# Patient Record
Sex: Female | Born: 1945
Health system: Southern US, Community
[De-identification: ages and names within clinical notes are randomized; demographics above are authoritative.]

## PROBLEM LIST (undated history)

## (undated) DIAGNOSIS — J45909 Unspecified asthma, uncomplicated: Secondary | ICD-10-CM

## (undated) DIAGNOSIS — M109 Gout, unspecified: Secondary | ICD-10-CM

## (undated) DIAGNOSIS — J449 Chronic obstructive pulmonary disease, unspecified: Secondary | ICD-10-CM

## (undated) DIAGNOSIS — I10 Essential (primary) hypertension: Secondary | ICD-10-CM

## (undated) HISTORY — PX: COLONOSCOPY: SHX174

## (undated) HISTORY — PX: COLONOSCOPY WITH PROPOFOL: SHX5780

## (undated) HISTORY — PX: ELBOW SURGERY: SHX618

---

## 1991-10-13 HISTORY — PX: BREAST EXCISIONAL BIOPSY: SUR124

## 2007-01-01 ENCOUNTER — Emergency Department: Payer: Self-pay | Admitting: Emergency Medicine

## 2007-03-22 ENCOUNTER — Ambulatory Visit: Payer: Self-pay | Admitting: Family Medicine

## 2007-10-29 ENCOUNTER — Emergency Department: Payer: Self-pay | Admitting: Emergency Medicine

## 2007-11-17 ENCOUNTER — Ambulatory Visit: Payer: Self-pay | Admitting: Family Medicine

## 2008-04-19 ENCOUNTER — Ambulatory Visit: Payer: Self-pay | Admitting: Family Medicine

## 2008-07-04 ENCOUNTER — Ambulatory Visit: Payer: Self-pay | Admitting: Family Medicine

## 2009-07-09 ENCOUNTER — Ambulatory Visit: Payer: Self-pay | Admitting: Family Medicine

## 2010-09-30 ENCOUNTER — Ambulatory Visit: Payer: Self-pay | Admitting: Family Medicine

## 2010-11-23 ENCOUNTER — Emergency Department: Payer: Self-pay | Admitting: Emergency Medicine

## 2011-03-29 ENCOUNTER — Emergency Department: Payer: Self-pay | Admitting: Emergency Medicine

## 2011-04-17 ENCOUNTER — Emergency Department: Payer: Self-pay | Admitting: *Deleted

## 2011-04-27 ENCOUNTER — Inpatient Hospital Stay: Payer: Self-pay | Admitting: Internal Medicine

## 2011-04-27 DIAGNOSIS — R079 Chest pain, unspecified: Secondary | ICD-10-CM

## 2011-04-27 DIAGNOSIS — R7989 Other specified abnormal findings of blood chemistry: Secondary | ICD-10-CM

## 2011-05-19 ENCOUNTER — Encounter: Payer: Self-pay | Admitting: Cardiovascular Disease

## 2011-07-29 ENCOUNTER — Emergency Department: Payer: Self-pay | Admitting: *Deleted

## 2011-09-23 ENCOUNTER — Emergency Department: Payer: Self-pay | Admitting: *Deleted

## 2011-09-26 ENCOUNTER — Inpatient Hospital Stay: Payer: Self-pay | Admitting: Internal Medicine

## 2011-10-29 ENCOUNTER — Ambulatory Visit: Payer: Self-pay | Admitting: Family Medicine

## 2012-04-12 ENCOUNTER — Emergency Department: Payer: Self-pay | Admitting: Emergency Medicine

## 2012-07-20 ENCOUNTER — Emergency Department: Payer: Self-pay | Admitting: Emergency Medicine

## 2014-07-03 ENCOUNTER — Ambulatory Visit: Payer: Self-pay | Admitting: Gastroenterology

## 2014-07-05 ENCOUNTER — Ambulatory Visit: Payer: Self-pay | Admitting: Family Medicine

## 2014-07-05 LAB — PATHOLOGY REPORT

## 2014-09-13 ENCOUNTER — Emergency Department: Payer: Self-pay | Admitting: Emergency Medicine

## 2014-11-05 ENCOUNTER — Encounter: Payer: Self-pay | Admitting: Surgery

## 2014-11-12 ENCOUNTER — Encounter: Payer: Self-pay | Admitting: Surgery

## 2014-12-11 ENCOUNTER — Encounter
Admit: 2014-12-11 | Disposition: A | Discharge status: Home / Self Care | Payer: Self-pay | Attending: Surgery | Admitting: Surgery

## 2015-02-03 NOTE — H&P (Signed)
PATIENT NAME:  Brandi Evans, Brandi Evans MR#:  [id number] DATE OF BIRTH:  August 07, 1946  DATE OF ADMISSION:  09/26/2011  Dictation was stopped in the middle.   REVIEW OF SYSTEMS: CONSTITUTIONAL: Patient denies any fever. EYES: No blurred vision. ENT: No tinnitus. No ear pain. No epistaxis. RESPIRATORY: No cough. No wheezing. CARDIOVASCULAR: Chest pain present. No orthopnea or pedal edema. No palpitations. GASTROINTESTINAL: No nausea. No vomiting. No abdominal pain. GENITOURINARY: Recently suffered a urinary tract infection. INTEGUMENT: No skin rashes. MUSCULOSKELETAL: No joint pain. NEUROLOGIC: No numbness or weakness.   PHYSICAL EXAMINATION:  VITAL SIGNS: Temperature 99.7, pulse 71, respirations 18, blood pressure 170/69, sats 97%.   GENERAL: She is alert, awake, oriented, not in distress.   HEENT: Head atraumatic, normocephalic. Pupils are equally reacting to light. Extraocular movements are intact. ENT: No tympanic membrane congestion. No turbinate hypertrophy. No oropharyngeal erythema.   NECK: Normal range of motion. No JVD. No carotid bruit.   CARDIOVASCULAR: S1 and S2 regular. No murmurs. Patient does have some costochondral tenderness.   RESPIRATORY: Bilaterally clear to auscultation. No wheeze. No rales.   ABDOMEN: Soft, nontender, nondistended. Bowel sounds present.   EXTREMITIES: No extremity edema. No cyanosis. No clubbing. No neurological deficits.   SKIN: Warm and dry.   NEUROLOGICAL: Patient has no focal neurological deficits.   PSYCH: Alert, awake, oriented x3.   LABORATORY, DIAGNOSTIC AND RADIOLOGICAL DATA: WBC 5.4, hemoglobin 13.2, hematocrit 39.9, platelets 250. Electrolytes: Sodium 142, potassium 3.4, chloride 101, bicarbonate 30, BUN 14, creatinine 1, glucose 97.   Troponin 0.11. Patient had a CAT scan of the abdomen on 12/13 which showed some atelectasis in the right middle lobe and lingula versus linear fibrosis. Patient does have some calcifications in the uterus and also  left adrenal adenoma. Patient found to have bilateral nodules. Right now EKG shows normal sinus rhythm with no ST-T changes, 70 beats per minute.   ASSESSMENT AND PLAN: 69 year old female with:  1. Chest pain and elevated troponins. Patient will be admitted to the hospitalist service for non-ST-elevation myocardial infarction. Trend the troponins along with CK and start the patient on aspirin, beta blockers, lisinopril and statins along with nitro paste and also full dose Lovenox. She had same complaint in July. At that time she had an echo and the echo showed ejection fraction of 55% but she did not have a stress test so we are going to schedule for stress test on Monday.  2. Patient has history of hypertension. She is already on beta blockers and HCTZ and ACE inhibitors. Blood pressure is fine.  3. History of pulmonary  fibrosis or atelectasis on the CAT scan recently. 4. Escherichia coli urinary tract infection. Patient will be on Cipro 500 mg IV b.i.d.  5. History of hyperlipidemia. She is on statins.  6. History of asthma. Continue Advair.   TOTAL TIME SPENT ON HISTORY AND PHYSICAL: More than 55 minutes.   ____________________________ Epifanio Lesches, MD sk:cms D: 09/26/2011 16:40:03 ET T: 09/27/2011 08:32:12 ET JOB#: [id number]  cc: Epifanio Lesches, MD, <Dictator> Epifanio Lesches MD ELECTRONICALLY SIGNED 10/15/2011 19:28

## 2015-02-03 NOTE — H&P (Signed)
PATIENT NAME:  Brandi Evans, Brandi Evans MR#:  [id number] DATE OF BIRTH:  1945-11-27  DATE OF ADMISSION:  09/26/2011  PRIMARY CARE PHYSICIAN: Dr. Lovie Macadamia  CHIEF COMPLAINT: Chest pain.   HISTORY OF PRESENT ILLNESS: Patient is a 69 year old female patient with history of hypertension, hyperlipidemia, recently diagnosed Escherichia coli and also history of asthma came in because of chest pain in the middle of the chest, started yesterday, radiating to the back. Patient says that pain medicines are relieving. Patient's pain is aggravated by movement and did not have any cough or fever. No trouble breathing. Patient found to have mild troponin elevation of 0.11 so I was asked to admit the patient for chest pain with positive troponins.  PAST MEDICAL HISTORY:  1. Hypertension.  2. Asthma.  3. She came to the Emergency Room on 13th and found to have urinary tract infection and she was discharged with antibiotics and pain medicines.   ALLERGIES: Penicillin.   SOCIAL HISTORY: Patient does not smoke. No alcohol. No drugs.   PAST SURGICAL HISTORY: Cesarean section.   FAMILY HISTORY: History of stroke and heart disease in the family.  1. MEDICATIONS: Advair 100/50, 1 puff b.i.d.  2. HCTZ 25 mg p.o. daily.  3. Metoprolol 50 mg p.o. b.i.d.  4. Gabapentin 300 mg p.o. daily p.r.n.    ____________________________ Epifanio Lesches, MD sk:cms D: 09/26/2011 16:30:49 ET T: 09/27/2011 08:28:01 ET JOB#: [id number]  cc: Epifanio Lesches, MD, <Dictator> Youlanda Roys. Lovie Macadamia, MD Epifanio Lesches MD ELECTRONICALLY SIGNED 10/15/2011 19:27

## 2015-02-03 NOTE — Consult Note (Signed)
PATIENT NAME:  Brandi Evans, DOWSE MR#:  [id number] DATE OF BIRTH:  1946/09/10  DATE OF CONSULTATION:  09/27/2011  REFERRING PHYSICIAN:  Dr. Vianne Bulls CONSULTING PHYSICIAN:  Isaias Cowman, MD  PRIMARY CARE PHYSICIAN: Dr. Lovie Macadamia  CHIEF COMPLAINT: Chest pain.   HISTORY OF PRESENT ILLNESS: The patient is a 69 year old female referred for evaluation of chest pain and elevated troponin. The patient was recently diagnosed with a urinary tract infection and treated with antibiotics. Subsequently, the patient developed substernal chest pressure which has persisted over 2 to 3 days. The patient was admitted to telemetry where she has a borderline elevated troponin of 0.11.   PAST MEDICAL HISTORY:  1. Hypertension.  2. Asthma.   MEDICATIONS:  1. Metoprolol 50 mg b.i.d.  2. HCTZ 25 mg daily.  3. Advair 100/50 1 puff b.i.d.  4. Gabapentin 300 mg daily.   SOCIAL HISTORY: The patient denies tobacco abuse.   FAMILY HISTORY: Notable for cerebrovascular disease.   REVIEW OF SYSTEMS:  CONSTITUTIONAL: No fever or chills. EYES: No blurry vision.  EARS: No hearing loss. RESPIRATORY: No shortness of breath. CARDIOVASCULAR: Chest pain as described above. GASTROINTESTINAL: No nausea, vomiting, diarrhea, or constipation.  GU:  The patient did have bilateral flank pain and dysuria. ENDOCRINE: The patient denies polyuria or polydipsia. MUSCULOSKELETAL: The patient denies arthralgias or myalgias. NEUROLOGICAL: The patient denies focal muscle weakness or numbness. PSYCH: The patient denies anxiety or depression.   PHYSICAL EXAMINATION:  VITAL SIGNS: Blood pressure 130/82, pulse 70, respirations 17, temperature 97.3, pulse ox 97%.   HEENT: Pupils equal, reactive to light and accommodation.   NECK: Supple without thyromegaly.   LUNGS: Clear.   HEART: Normal jugular venous pressure. Normal point of maximal impulse. Regular rate and rhythm. Normal S1, S2. No appreciable gallop, murmur, or rub.   ABDOMEN:  Soft and nontender. Pulses were intact bilaterally.   MUSCULOSKELETAL: Normal muscle tone.   NEUROLOGIC: The patient is alert and oriented times three. Motor and sensory are both grossly intact.   IMPRESSION: 69 year old female who presents with chest pain and elevated troponin.   RECOMMENDATIONS:  1. Agree with current therapy.  2. Continue full dose anticoagulation with Lovenox.  3. Proceed with cardiac catheterization with selective coronary arteriography in the a.m. The risks, benefits, and alternatives were explained and informed written consent obtained.   ____________________________ Isaias Cowman, MD ap:bjt D: 09/27/2011 10:07:12 ET T: 09/27/2011 15:49:29 ET JOB#: [id number]  cc: Isaias Cowman, MD, <Dictator> Isaias Cowman MD ELECTRONICALLY SIGNED 10/24/2011 10:03

## 2015-07-21 ENCOUNTER — Encounter: Payer: Self-pay | Admitting: Emergency Medicine

## 2015-07-21 ENCOUNTER — Emergency Department: Payer: Commercial Managed Care - HMO

## 2015-07-21 ENCOUNTER — Emergency Department
Admission: EM | Admit: 2015-07-21 | Discharge: 2015-07-21 | Disposition: A | Discharge status: Home / Self Care | Payer: Commercial Managed Care - HMO | Attending: Emergency Medicine | Admitting: Emergency Medicine

## 2015-07-21 DIAGNOSIS — H6123 Impacted cerumen, bilateral: Secondary | ICD-10-CM | POA: Insufficient documentation

## 2015-07-21 DIAGNOSIS — Z880 Allergy status to penicillin: Secondary | ICD-10-CM | POA: Diagnosis not present

## 2015-07-21 DIAGNOSIS — H9202 Otalgia, left ear: Secondary | ICD-10-CM | POA: Diagnosis present

## 2015-07-21 HISTORY — DX: Unspecified asthma, uncomplicated: J45.909

## 2015-07-21 MED ORDER — KETOROLAC TROMETHAMINE 30 MG/ML IJ SOLN: 30.0000 mg | Freq: Once | INTRAMUSCULAR | Status: DC

## 2015-07-21 MED ORDER — CARBAMIDE PEROXIDE 6.5 % OT SOLN
5.0000 [drp] | Freq: Once | OTIC | Status: AC
  Administered 2015-07-21: 5 [drp] via OTIC
  Filled 2015-07-21: qty 15

## 2015-07-21 NOTE — ED Notes (Signed)
Patient with no complaints at this time. Respirations even and unlabored. Skin warm/dry. Discharge instructions reviewed with patient at this time. Patient given opportunity to voice concerns/ask questions. Patient discharged at this time and left Emergency Department with steady gait.   

## 2015-07-21 NOTE — ED Provider Notes (Signed)
[facility] Emergency Department Provider Note  ____________________________________________  Time seen: Approximately 6:06 PM  I have reviewed the triage vital signs and the nursing notes.   HISTORY  Chief Complaint Otalgia    HPI Brandi Evans is a 69 y.o. female presents with decreased hearing in left ear.Patient states that he has been decreased for the past 3 days and feels like a roaring sound was going on in her head.   Past Medical History  Diagnosis Date  . Asthma     There are no active problems to display for this patient.   Past Surgical History  Procedure Laterality Date  . Cesarean section    . Elbow surgery      No current outpatient prescriptions on file.  Allergies Penicillins  No family history on file.  Social History Social History  Substance Use Topics  . Smoking status: Never Smoker   . Smokeless tobacco: None  . Alcohol Use: No    Review of Systems Constitutional: No fever/chills Eyes: No visual changes. ENT: No sore throat. Decreased hearing left ear. Cardiovascular: Denies chest pain. Respiratory: Denies shortness of breath. Gastrointestinal: No abdominal pain.  No nausea, no vomiting.  No diarrhea.  No constipation. Genitourinary: Negative for dysuria. Musculoskeletal: Negative for back pain. Skin: Negative for rash. Neurological: Negative for headaches, focal weakness or numbness.  10-point ROS otherwise negative.  ____________________________________________   PHYSICAL EXAM:  VITAL SIGNS: ED Triage Vitals  Enc Vitals Group     BP 07/21/15 1715 160/93 mmHg     Pulse Rate 07/21/15 1715 71     Resp 07/21/15 1715 16     Temp 07/21/15 1715 97.9 F (36.6 C)     Temp Source 07/21/15 1715 Oral     SpO2 07/21/15 1715 96 %     Weight 07/21/15 1715 155 lb (70.308 kg)     Height 07/21/15 1715 5\' 2"  (1.575 m)     Head Cir --      Peak Flow --      Pain Score 07/21/15 1716 6     Pain Loc --       Pain Edu? --      Excl. in [place]? --     Constitutional: Alert and oriented. Well appearing and in no acute distress. Eyes: Conjunctivae are normal. PERRL. EOMI. Head: Atraumatic. Cerumen impaction both ears. Nose: No congestion/rhinnorhea. Mouth/Throat: Mucous membranes are moist.  Oropharynx non-erythematous. Neck: No stridor.   Musculoskeletal: No lower extremity tenderness nor edema.  No joint effusions. Neurologic:  Normal speech and language. No gross focal neurologic deficits are appreciated. No gait instability. Skin:  Skin is warm, dry and intact. No rash noted. Psychiatric: Mood and affect are normal. Speech and behavior are normal.  ____________________________________________   LABS (all labs ordered are listed, but only abnormal results are displayed)  Labs Reviewed - No data to display ____________________________________________   PROCEDURES  Procedure(s) performed: None  Critical Care performed: No  ____________________________________________   INITIAL IMPRESSION / ASSESSMENT AND PLAN / ED COURSE  Pertinent labs & imaging results that were available during my care of the patient were reviewed by me and considered in my medical decision making (see chart for details).  Bilateral cerumen impaction. Both ears irrigated with complete resolution. Patient states that she can hear completely with no difficulties. Discharged to follow-up with her PCP or return to the ER as needed. ____________________________________________   FINAL CLINICAL IMPRESSION(S) / ED DIAGNOSES  Final diagnoses:  Cerumen  impaction, bilateral      Arlyss Repress, PA-C 07/21/15 1935  Eula Listen, MD 07/21/15 1946

## 2015-07-21 NOTE — ED Notes (Signed)
Pt reports left ear pain x3 days, reports unable to hear. Pt denies injury.

## 2015-07-21 NOTE — Discharge Instructions (Signed)
Cerumen Impaction The structures of the external ear canal secrete a waxy substance known as cerumen. Excess cerumen can build up in the ear canal, causing a condition known as cerumen impaction. Cerumen impaction can cause ear pain and disrupt the function of the ear. The rate of cerumen production differs for each individual. In certain individuals, the configuration of the ear canal may decrease his or her ability to naturally remove cerumen. CAUSES Cerumen impaction is caused by excessive cerumen production or buildup. RISK FACTORS  Frequent use of swabs to clean ears.  Having narrow ear canals.  Having eczema.  Being dehydrated. SIGNS AND SYMPTOMS  Diminished hearing.  Ear drainage.  Ear pain.  Ear itch. TREATMENT Treatment may involve:  Over-the-counter or prescription ear drops to soften the cerumen.  Removal of cerumen by a health care provider. This may be done with:  Irrigation with warm water. This is the most common method of removal.  Ear curettes and other instruments.  Surgery. This may be done in severe cases. HOME CARE INSTRUCTIONS  Take medicines only as directed by your health care provider.  Do not insert objects into the ear with the intent of cleaning the ear. PREVENTION  Do not insert objects into the ear, even with the intent of cleaning the ear. Removing cerumen as a part of normal hygiene is not necessary, and the use of swabs in the ear canal is not recommended.  Drink enough water to keep your urine clear or pale yellow.  Control your eczema if you have it. SEEK MEDICAL CARE IF:  You develop ear pain.  You develop bleeding from the ear.  The cerumen does not clear after you use ear drops as directed.   This information is not intended to replace advice given to you by your health care provider. Make sure you discuss any questions you have with your health care provider.   Document Released: 11/05/2004 Document Revised: 10/19/2014  Document Reviewed: 05/15/2015 Elsevier Interactive Patient Education 2016 [employer]  

## 2015-12-05 ENCOUNTER — Other Ambulatory Visit: Payer: Self-pay | Admitting: Family Medicine

## 2015-12-05 DIAGNOSIS — Z1231 Encounter for screening mammogram for malignant neoplasm of breast: Secondary | ICD-10-CM

## 2015-12-20 ENCOUNTER — Other Ambulatory Visit: Payer: Self-pay | Admitting: Family Medicine

## 2015-12-20 ENCOUNTER — Ambulatory Visit
Admission: RE | Admit: 2015-12-20 | Discharge: 2015-12-20 | Disposition: A | Discharge status: Home / Self Care | Payer: Commercial Managed Care - HMO | Source: Ambulatory Visit | Attending: Family Medicine | Admitting: Family Medicine

## 2015-12-20 DIAGNOSIS — Z1231 Encounter for screening mammogram for malignant neoplasm of breast: Secondary | ICD-10-CM | POA: Diagnosis not present

## 2016-10-27 DIAGNOSIS — B351 Tinea unguium: Secondary | ICD-10-CM | POA: Diagnosis not present

## 2016-10-27 DIAGNOSIS — L239 Allergic contact dermatitis, unspecified cause: Secondary | ICD-10-CM | POA: Diagnosis not present

## 2016-10-27 DIAGNOSIS — L815 Leukoderma, not elsewhere classified: Secondary | ICD-10-CM | POA: Diagnosis not present

## 2016-11-09 DIAGNOSIS — Z79899 Other long term (current) drug therapy: Secondary | ICD-10-CM | POA: Diagnosis not present

## 2016-12-25 DIAGNOSIS — M109 Gout, unspecified: Secondary | ICD-10-CM | POA: Diagnosis not present

## 2016-12-25 DIAGNOSIS — I10 Essential (primary) hypertension: Secondary | ICD-10-CM | POA: Diagnosis not present

## 2016-12-25 DIAGNOSIS — Z1231 Encounter for screening mammogram for malignant neoplasm of breast: Secondary | ICD-10-CM | POA: Diagnosis not present

## 2016-12-25 DIAGNOSIS — J449 Chronic obstructive pulmonary disease, unspecified: Secondary | ICD-10-CM | POA: Diagnosis not present

## 2016-12-25 DIAGNOSIS — Z0000 Encounter for general adult medical examination without abnormal findings: Secondary | ICD-10-CM | POA: Diagnosis not present

## 2016-12-25 DIAGNOSIS — E785 Hyperlipidemia, unspecified: Secondary | ICD-10-CM | POA: Diagnosis not present

## 2017-01-05 ENCOUNTER — Other Ambulatory Visit: Payer: Self-pay | Admitting: Family Medicine

## 2017-01-05 DIAGNOSIS — Z1239 Encounter for other screening for malignant neoplasm of breast: Secondary | ICD-10-CM

## 2017-02-01 ENCOUNTER — Ambulatory Visit
Admission: RE | Admit: 2017-02-01 | Discharge: 2017-02-01 | Disposition: A | Discharge status: Home / Self Care | Payer: PPO | Source: Ambulatory Visit | Attending: Family Medicine | Admitting: Family Medicine

## 2017-02-01 DIAGNOSIS — Z1231 Encounter for screening mammogram for malignant neoplasm of breast: Secondary | ICD-10-CM | POA: Insufficient documentation

## 2017-02-01 DIAGNOSIS — Z1239 Encounter for other screening for malignant neoplasm of breast: Secondary | ICD-10-CM

## 2017-02-24 DIAGNOSIS — H2513 Age-related nuclear cataract, bilateral: Secondary | ICD-10-CM | POA: Diagnosis not present

## 2017-02-24 DIAGNOSIS — H53001 Unspecified amblyopia, right eye: Secondary | ICD-10-CM | POA: Diagnosis not present

## 2017-05-06 DIAGNOSIS — J029 Acute pharyngitis, unspecified: Secondary | ICD-10-CM | POA: Diagnosis not present

## 2017-05-06 DIAGNOSIS — J069 Acute upper respiratory infection, unspecified: Secondary | ICD-10-CM | POA: Diagnosis not present

## 2017-05-21 DIAGNOSIS — B370 Candidal stomatitis: Secondary | ICD-10-CM | POA: Diagnosis not present

## 2017-09-30 DIAGNOSIS — I10 Essential (primary) hypertension: Secondary | ICD-10-CM | POA: Diagnosis not present

## 2017-09-30 DIAGNOSIS — R682 Dry mouth, unspecified: Secondary | ICD-10-CM | POA: Diagnosis not present

## 2017-09-30 DIAGNOSIS — J449 Chronic obstructive pulmonary disease, unspecified: Secondary | ICD-10-CM | POA: Diagnosis not present

## 2017-10-29 DIAGNOSIS — N760 Acute vaginitis: Secondary | ICD-10-CM | POA: Diagnosis not present

## 2017-10-29 DIAGNOSIS — B9689 Other specified bacterial agents as the cause of diseases classified elsewhere: Secondary | ICD-10-CM | POA: Diagnosis not present

## 2017-10-29 DIAGNOSIS — N898 Other specified noninflammatory disorders of vagina: Secondary | ICD-10-CM | POA: Diagnosis not present

## 2017-11-04 DIAGNOSIS — H5203 Hypermetropia, bilateral: Secondary | ICD-10-CM | POA: Diagnosis not present

## 2017-11-04 DIAGNOSIS — H52203 Unspecified astigmatism, bilateral: Secondary | ICD-10-CM | POA: Diagnosis not present

## 2017-11-04 DIAGNOSIS — H25813 Combined forms of age-related cataract, bilateral: Secondary | ICD-10-CM | POA: Diagnosis not present

## 2017-11-04 DIAGNOSIS — H524 Presbyopia: Secondary | ICD-10-CM | POA: Diagnosis not present

## 2017-12-28 ENCOUNTER — Other Ambulatory Visit: Payer: Self-pay | Admitting: Family Medicine

## 2017-12-28 DIAGNOSIS — Z1231 Encounter for screening mammogram for malignant neoplasm of breast: Secondary | ICD-10-CM

## 2018-01-01 ENCOUNTER — Other Ambulatory Visit: Payer: Self-pay

## 2018-01-01 ENCOUNTER — Emergency Department
Admission: EM | Admit: 2018-01-01 | Discharge: 2018-01-01 | Disposition: A | Discharge status: Home / Self Care | Payer: PPO | Attending: Emergency Medicine | Admitting: Emergency Medicine

## 2018-01-01 ENCOUNTER — Encounter: Payer: Self-pay | Admitting: Emergency Medicine

## 2018-01-01 DIAGNOSIS — J45909 Unspecified asthma, uncomplicated: Secondary | ICD-10-CM | POA: Diagnosis not present

## 2018-01-01 DIAGNOSIS — H00014 Hordeolum externum left upper eyelid: Secondary | ICD-10-CM | POA: Diagnosis not present

## 2018-01-01 DIAGNOSIS — H5711 Ocular pain, right eye: Secondary | ICD-10-CM | POA: Diagnosis present

## 2018-01-01 MED ORDER — IBUPROFEN 600 MG PO TABS
600.0000 mg | ORAL_TABLET | Freq: Once | ORAL | Status: AC
Start: 2018-01-01 — End: 2018-01-01
  Administered 2018-01-01: 600 mg via ORAL
  Filled 2018-01-01: qty 1

## 2018-01-01 NOTE — Discharge Instructions (Addendum)
You have sty to the upper lid. Continue to apply warm compresses to promote healing. Take OTC ibuprofen for pain relief. See your provider or [facility] as needed.

## 2018-01-01 NOTE — ED Notes (Signed)
Pt c/o left eye pain and watering x6 days - denies injury to eye

## 2018-01-01 NOTE — ED Triage Notes (Signed)
L upper eyelid swelling and pain x 6 days.

## 2018-01-02 NOTE — ED Provider Notes (Signed)
[facility] Emergency Department Provider Note ____________________________________________  Time seen: 1605  I have reviewed the triage vital signs and the nursing notes.  HISTORY  Chief Complaint  Eye Problem  HPI Brandi Evans is a 72 y.o. female presents himself to the ED for evaluation of right eyelid pain and swelling.  Patient denies any visual disturbance.  She does note some tearing to the right eye, but primarily complains of a focal area of tenderness and swelling to the upper lid.  She has been applying warm compress to the area but denies any significant benefit.  She reports pressure at the lid as well as a mild headache with it.  She denies any nausea, vomiting, dizziness, or visual changes.  Past Medical History:  Diagnosis Date  . Asthma     There are no active problems to display for this patient.   Past Surgical History:  Procedure Laterality Date  . BREAST EXCISIONAL BIOPSY Left 1993   neg  . CESAREAN SECTION    . ELBOW SURGERY      Prior to Admission medications   Not on File    Allergies Penicillins  No family history on file.  Social History Social History   Tobacco Use  . Smoking status: Never Smoker  Substance Use Topics  . Alcohol use: No  . Drug use: No    Review of Systems  Constitutional: Negative for fever. Eyes: Negative for visual changes. ENT: Negative for sore throat.  Upper eyelid swelling as above. Cardiovascular: Negative for chest pain. Respiratory: Negative for shortness of breath. Gastrointestinal: Negative for abdominal pain, vomiting and diarrhea. Skin: Negative for rash. Neurological: Negative for headaches, focal weakness or numbness. ____________________________________________  PHYSICAL EXAM:  VITAL SIGNS: ED Triage Vitals  Enc Vitals Group     BP 01/01/18 1448 (!) 171/82     Pulse Rate 01/01/18 1448 68     Resp 01/01/18 1448 18     Temp 01/01/18 1448 98.2 F (36.8 C)   Temp Source 01/01/18 1448 Oral     SpO2 01/01/18 1448 97 %     Weight 01/01/18 1449 153 lb (69.4 kg)     Height 01/01/18 1449 5\' 2"  (1.575 m)     Head Circumference --      Peak Flow --      Pain Score 01/01/18 1448 9     Pain Loc --      Pain Edu? --      Excl. in [place]? --     Constitutional: Alert and oriented. Well appearing and in no distress. Head: Normocephalic and atraumatic. Eyes: Conjunctivae are normal. PERRL. Normal extraocular movements.  Right eye with a focal area of tenderness and swelling noted to the upper lid at the lash margin.  Eversion of the lid does not reveal any pointing fluctuance or spontaneous drainage. Hematological/Lymphatic/Immunological: No preauricular lymphadenopathy. Cardiovascular: Normal rate, regular rhythm. Normal distal pulses. Respiratory: Normal respiratory effort. No wheezes/rales/rhonchi. Neurologic:  Normal gait without ataxia. Normal speech and language. No gross focal neurologic deficits are appreciated. ____________________________________________  PROCEDURES  Procedures   IBU 600 mg PO ____________________________________________  INITIAL IMPRESSION / ASSESSMENT AND PLAN / ED COURSE  Patient with ED evaluation of right upper lid swelling and tenderness consistent with a hordeolum.  Patient's exam is otherwise unremarkable.  She is advised that there is no specific treatment for the stye.  She will continue to apply warm compresses and use rewetting drops for the eyes.  She is  referred to [facility] for ongoing management of any symptoms that are refractory to conservative treatment.  She is encouraged to take over-the-counter ibuprofen or Tylenol as needed for pain relief.  Return precautions have been reviewed. ____________________________________________  FINAL CLINICAL IMPRESSION(S) / ED DIAGNOSES  Final diagnoses:  Hordeolum externum of left upper eyelid      Carmie End, Dannielle Karvonen, PA-C 01/02/18 1219    Delman Kitten, MD 01/02/18 [phone]

## 2018-02-02 ENCOUNTER — Ambulatory Visit
Admission: RE | Admit: 2018-02-02 | Discharge: 2018-02-02 | Disposition: A | Discharge status: Home / Self Care | Payer: PPO | Source: Ambulatory Visit | Attending: Family Medicine | Admitting: Family Medicine

## 2018-02-02 DIAGNOSIS — Z1231 Encounter for screening mammogram for malignant neoplasm of breast: Secondary | ICD-10-CM | POA: Insufficient documentation

## 2018-03-16 DIAGNOSIS — H524 Presbyopia: Secondary | ICD-10-CM | POA: Diagnosis not present

## 2018-05-05 DIAGNOSIS — H524 Presbyopia: Secondary | ICD-10-CM | POA: Diagnosis not present

## 2018-05-05 DIAGNOSIS — H5203 Hypermetropia, bilateral: Secondary | ICD-10-CM | POA: Diagnosis not present

## 2018-05-05 DIAGNOSIS — H25813 Combined forms of age-related cataract, bilateral: Secondary | ICD-10-CM | POA: Diagnosis not present

## 2018-05-05 DIAGNOSIS — H52203 Unspecified astigmatism, bilateral: Secondary | ICD-10-CM | POA: Diagnosis not present

## 2018-08-01 DIAGNOSIS — J449 Chronic obstructive pulmonary disease, unspecified: Secondary | ICD-10-CM | POA: Diagnosis not present

## 2018-08-01 DIAGNOSIS — Z23 Encounter for immunization: Secondary | ICD-10-CM | POA: Diagnosis not present

## 2018-08-26 DIAGNOSIS — I10 Essential (primary) hypertension: Secondary | ICD-10-CM | POA: Diagnosis not present

## 2018-08-26 DIAGNOSIS — R0602 Shortness of breath: Secondary | ICD-10-CM | POA: Diagnosis not present

## 2018-08-26 DIAGNOSIS — J449 Chronic obstructive pulmonary disease, unspecified: Secondary | ICD-10-CM | POA: Diagnosis not present

## 2018-11-21 DIAGNOSIS — J441 Chronic obstructive pulmonary disease with (acute) exacerbation: Secondary | ICD-10-CM | POA: Diagnosis not present

## 2018-12-09 DIAGNOSIS — E785 Hyperlipidemia, unspecified: Secondary | ICD-10-CM | POA: Diagnosis not present

## 2018-12-09 DIAGNOSIS — J4540 Moderate persistent asthma, uncomplicated: Secondary | ICD-10-CM | POA: Diagnosis not present

## 2018-12-09 DIAGNOSIS — Z0000 Encounter for general adult medical examination without abnormal findings: Secondary | ICD-10-CM | POA: Diagnosis not present

## 2018-12-09 DIAGNOSIS — Z124 Encounter for screening for malignant neoplasm of cervix: Secondary | ICD-10-CM | POA: Diagnosis not present

## 2018-12-09 DIAGNOSIS — I10 Essential (primary) hypertension: Secondary | ICD-10-CM | POA: Diagnosis not present

## 2019-01-25 ENCOUNTER — Other Ambulatory Visit: Payer: Self-pay | Admitting: Family Medicine

## 2019-01-25 DIAGNOSIS — Z1231 Encounter for screening mammogram for malignant neoplasm of breast: Secondary | ICD-10-CM

## 2019-02-16 DIAGNOSIS — N760 Acute vaginitis: Secondary | ICD-10-CM | POA: Diagnosis not present

## 2019-02-16 DIAGNOSIS — N898 Other specified noninflammatory disorders of vagina: Secondary | ICD-10-CM | POA: Diagnosis not present

## 2019-03-20 ENCOUNTER — Other Ambulatory Visit: Payer: Self-pay

## 2019-03-20 ENCOUNTER — Ambulatory Visit
Admission: RE | Admit: 2019-03-20 | Discharge: 2019-03-20 | Disposition: A | Discharge status: Home / Self Care | Payer: PPO | Source: Ambulatory Visit | Attending: Family Medicine | Admitting: Family Medicine

## 2019-03-20 DIAGNOSIS — Z1231 Encounter for screening mammogram for malignant neoplasm of breast: Secondary | ICD-10-CM | POA: Diagnosis not present

## 2019-04-26 DIAGNOSIS — W57XXXS Bitten or stung by nonvenomous insect and other nonvenomous arthropods, sequela: Secondary | ICD-10-CM | POA: Diagnosis not present

## 2019-04-26 DIAGNOSIS — H6123 Impacted cerumen, bilateral: Secondary | ICD-10-CM | POA: Diagnosis not present

## 2019-04-26 DIAGNOSIS — R21 Rash and other nonspecific skin eruption: Secondary | ICD-10-CM | POA: Diagnosis not present

## 2019-06-14 DIAGNOSIS — E785 Hyperlipidemia, unspecified: Secondary | ICD-10-CM | POA: Diagnosis not present

## 2019-06-14 DIAGNOSIS — I10 Essential (primary) hypertension: Secondary | ICD-10-CM | POA: Diagnosis not present

## 2019-06-14 DIAGNOSIS — J4540 Moderate persistent asthma, uncomplicated: Secondary | ICD-10-CM | POA: Diagnosis not present

## 2019-06-20 DIAGNOSIS — J4540 Moderate persistent asthma, uncomplicated: Secondary | ICD-10-CM | POA: Diagnosis not present

## 2019-07-03 DIAGNOSIS — J449 Chronic obstructive pulmonary disease, unspecified: Secondary | ICD-10-CM | POA: Diagnosis not present

## 2019-07-03 DIAGNOSIS — Z7982 Long term (current) use of aspirin: Secondary | ICD-10-CM | POA: Diagnosis not present

## 2019-07-03 DIAGNOSIS — H25811 Combined forms of age-related cataract, right eye: Secondary | ICD-10-CM | POA: Diagnosis not present

## 2019-07-03 DIAGNOSIS — I10 Essential (primary) hypertension: Secondary | ICD-10-CM | POA: Diagnosis not present

## 2019-07-03 DIAGNOSIS — Z87891 Personal history of nicotine dependence: Secondary | ICD-10-CM | POA: Diagnosis not present

## 2019-07-03 DIAGNOSIS — E785 Hyperlipidemia, unspecified: Secondary | ICD-10-CM | POA: Diagnosis not present

## 2019-08-29 DIAGNOSIS — R197 Diarrhea, unspecified: Secondary | ICD-10-CM | POA: Diagnosis not present

## 2019-08-29 DIAGNOSIS — Z23 Encounter for immunization: Secondary | ICD-10-CM | POA: Diagnosis not present

## 2019-08-29 DIAGNOSIS — R220 Localized swelling, mass and lump, head: Secondary | ICD-10-CM | POA: Diagnosis not present

## 2019-11-13 DIAGNOSIS — J4520 Mild intermittent asthma, uncomplicated: Secondary | ICD-10-CM | POA: Diagnosis not present

## 2020-01-08 DIAGNOSIS — J4540 Moderate persistent asthma, uncomplicated: Secondary | ICD-10-CM | POA: Diagnosis not present

## 2020-01-08 DIAGNOSIS — I10 Essential (primary) hypertension: Secondary | ICD-10-CM | POA: Diagnosis not present

## 2020-01-08 DIAGNOSIS — R0989 Other specified symptoms and signs involving the circulatory and respiratory systems: Secondary | ICD-10-CM | POA: Diagnosis not present

## 2020-01-31 DIAGNOSIS — J4540 Moderate persistent asthma, uncomplicated: Secondary | ICD-10-CM | POA: Diagnosis not present

## 2020-01-31 DIAGNOSIS — Z0000 Encounter for general adult medical examination without abnormal findings: Secondary | ICD-10-CM | POA: Diagnosis not present

## 2020-01-31 DIAGNOSIS — I10 Essential (primary) hypertension: Secondary | ICD-10-CM | POA: Diagnosis not present

## 2020-01-31 DIAGNOSIS — J309 Allergic rhinitis, unspecified: Secondary | ICD-10-CM | POA: Diagnosis not present

## 2020-01-31 DIAGNOSIS — E785 Hyperlipidemia, unspecified: Secondary | ICD-10-CM | POA: Diagnosis not present

## 2020-02-20 DIAGNOSIS — Z86010 Personal history of colonic polyps: Secondary | ICD-10-CM | POA: Diagnosis not present

## 2020-02-20 DIAGNOSIS — R0602 Shortness of breath: Secondary | ICD-10-CM | POA: Diagnosis not present

## 2020-02-20 DIAGNOSIS — J4540 Moderate persistent asthma, uncomplicated: Secondary | ICD-10-CM | POA: Diagnosis not present

## 2020-02-23 ENCOUNTER — Other Ambulatory Visit: Payer: Self-pay | Admitting: Family Medicine

## 2020-02-23 DIAGNOSIS — Z1231 Encounter for screening mammogram for malignant neoplasm of breast: Secondary | ICD-10-CM

## 2020-03-18 DIAGNOSIS — R0602 Shortness of breath: Secondary | ICD-10-CM | POA: Diagnosis not present

## 2020-03-26 ENCOUNTER — Ambulatory Visit
Admission: RE | Admit: 2020-03-26 | Discharge: 2020-03-26 | Disposition: A | Discharge status: Home / Self Care | Payer: PPO | Source: Ambulatory Visit | Attending: Family Medicine | Admitting: Family Medicine

## 2020-03-26 DIAGNOSIS — Z1231 Encounter for screening mammogram for malignant neoplasm of breast: Secondary | ICD-10-CM | POA: Insufficient documentation

## 2020-04-01 DIAGNOSIS — J449 Chronic obstructive pulmonary disease, unspecified: Secondary | ICD-10-CM | POA: Diagnosis not present

## 2020-04-01 DIAGNOSIS — R0602 Shortness of breath: Secondary | ICD-10-CM | POA: Diagnosis not present

## 2020-04-01 DIAGNOSIS — Z01818 Encounter for other preprocedural examination: Secondary | ICD-10-CM | POA: Diagnosis not present

## 2020-05-21 DIAGNOSIS — M79631 Pain in right forearm: Secondary | ICD-10-CM | POA: Diagnosis not present

## 2020-05-21 DIAGNOSIS — T22111A Burn of first degree of right forearm, initial encounter: Secondary | ICD-10-CM | POA: Diagnosis not present

## 2020-05-28 ENCOUNTER — Emergency Department
Admission: EM | Admit: 2020-05-28 | Discharge: 2020-05-28 | Disposition: A | Discharge status: Home / Self Care | Payer: PPO | Attending: Emergency Medicine | Admitting: Emergency Medicine

## 2020-05-28 ENCOUNTER — Other Ambulatory Visit: Payer: Self-pay

## 2020-05-28 DIAGNOSIS — T782XXA Anaphylactic shock, unspecified, initial encounter: Secondary | ICD-10-CM | POA: Diagnosis not present

## 2020-05-28 DIAGNOSIS — R0602 Shortness of breath: Secondary | ICD-10-CM | POA: Diagnosis not present

## 2020-05-28 DIAGNOSIS — T7840XA Allergy, unspecified, initial encounter: Secondary | ICD-10-CM | POA: Diagnosis not present

## 2020-05-28 DIAGNOSIS — Z91018 Allergy to other foods: Secondary | ICD-10-CM | POA: Insufficient documentation

## 2020-05-28 DIAGNOSIS — J45909 Unspecified asthma, uncomplicated: Secondary | ICD-10-CM | POA: Diagnosis not present

## 2020-05-28 DIAGNOSIS — R061 Stridor: Secondary | ICD-10-CM | POA: Diagnosis not present

## 2020-05-28 DIAGNOSIS — R069 Unspecified abnormalities of breathing: Secondary | ICD-10-CM | POA: Diagnosis not present

## 2020-05-28 DIAGNOSIS — R0689 Other abnormalities of breathing: Secondary | ICD-10-CM | POA: Diagnosis not present

## 2020-05-28 DIAGNOSIS — R0902 Hypoxemia: Secondary | ICD-10-CM | POA: Diagnosis not present

## 2020-05-28 MED ORDER — PREDNISONE 20 MG PO TABS: 40.0000 mg | ORAL_TABLET | Freq: Every day | ORAL | 0 refills | Status: DC

## 2020-05-28 MED ORDER — EPINEPHRINE 0.3 MG/0.3ML IJ SOAJ: 0.3000 mg | INTRAMUSCULAR | 1 refills | Status: AC | PRN

## 2020-05-28 NOTE — ED Provider Notes (Signed)
[facility] Emergency Department Provider Note  Time seen: 6:23 PM  I have reviewed the triage vital signs and the nursing notes.   HISTORY  Chief Complaint Allergic Reaction   HPI Brandi Evans is a 74 y.o. female with a past medical history of anemia presents to the emergency department for a significant allergic reaction.  According to the patient around 4 PM today she began eating hazelnuts.  Patient states she has never had a reaction to any type of nuts previously.  However within 10 to 15 minutes she began itching in her armpits and elbows, began feeling very dizzy lightheaded and nauseated .  Patient then developed severe eye swelling difficulty breathing and itching in her throat.  Fire department arrived and gave the patient an EpiPen.  EMS arrived and patient continued to have shortness of breath wheeze and swelling patient was given a second EpiPen along with 125 mg of Solu-Medrol, Benadryl Pepcid and albuterol.  Upon arrival to the emergency department patient continues to have mildly edematous periorbital areas.  Patient states the itching in her throat is gone.  Denies any shortness of breath at this time.  Hives have largely resolved.  Past Medical History:  Diagnosis Date  . Asthma     There are no problems to display for this patient.   Past Surgical History:  Procedure Laterality Date  . BREAST EXCISIONAL BIOPSY Left 1993   neg  . CESAREAN SECTION    . ELBOW SURGERY      Prior to Admission medications   Not on File    Allergies  Allergen Reactions  . Other Anaphylaxis    Hazel nuts  . Penicillins Hives    No family history on file.  Social History Social History   Tobacco Use  . Smoking status: Never Smoker  Substance Use Topics  . Alcohol use: No  . Drug use: No    Review of Systems Constitutional: Negative for fever. Eyes: Swelling around her eyes, improved ENT: Itching in her throat, now  resolved Cardiovascular: Negative for chest pain. Respiratory: Positive for shortness of breath, now resolved Gastrointestinal: Negative for abdominal pain Musculoskeletal: Negative for musculoskeletal complaints Skin: Itching and hives all over her body, largely resolved. Neurological: Negative for headache All other ROS negative  ____________________________________________   PHYSICAL EXAM:  VITAL SIGNS: ED Triage Vitals  Enc Vitals Group     BP 05/28/20 1700 (!) 151/75     Pulse Rate 05/28/20 1700 69     Resp 05/28/20 1700 18     Temp 05/28/20 1700 97.8 F (36.6 C)     Temp src --      SpO2 05/28/20 1700 100 %     Weight 05/28/20 1658 158 lb (71.7 kg)     Height 05/28/20 1658 5\' 2"  (1.575 m)     Head Circumference --      Peak Flow --      Pain Score 05/28/20 1658 0     Pain Loc --      Pain Edu? --      Excl. in [place]? --     Constitutional: Alert and oriented. Well appearing and in no distress. Eyes: Mild to moderate periorbital edema bilaterally ENT      Head: Normocephalic and atraumatic.      Mouth/Throat: Mucous membranes are moist.  No oral edema noted.  Normal pharynx. Cardiovascular: Normal rate, regular rhythm.  Respiratory: Normal respiratory effort without tachypnea nor retractions. Breath sounds are  clear without wheeze. Gastrointestinal: Soft and nontender. No distention.   Musculoskeletal: Nontender with normal range of motion in all extremities.  Neurologic:  Normal speech and language. No gross focal neurologic deficits  Skin: Several scattered urticaria although patient states much improved from earlier. Psychiatric: Mood and affect are normal.   ____________________________________________    EKG  EKG viewed and interpreted by myself shows a normal sinus rhythm at 69 bpm with a narrow QRS, normal axis, normal intervals, no concerning ST changes.  ____________________________________________   INITIAL IMPRESSION / ASSESSMENT AND PLAN / ED  COURSE  Pertinent labs & imaging results that were available during my care of the patient were reviewed by me and considered in my medical decision making (see chart for details).   Patient presents to the emergency department after eating hazelnuts with what appears to be an anaphylactic reaction.  Patient states she is feeling much better no longer has shortness of breath throat itching and the swelling around her eyes has diminished although it is still present.  Patient has received 2 doses of epinephrine along with Solu-Medrol Benadryl and Pepcid.  We will continue to closely monitor in the emergency department.  Reassuringly largely normal vitals.  Clear lung sounds no signs of oral edema although the patient still has mild to moderate periorbital edema.  Patient will likely require prolonged monitoring in the emergency department, if symptoms do not completely resolve patient may require admission.  Patient is dramatically improved.  Minimal to no swelling around the eyes.  Patient is asking to be discharged home.  States she feels well.  Patient appears quite well at this time.  We will discharge with prednisone for the next 5 days, Benadryl as needed and will also prescribe EpiPen for the patient be used for any further or future anaphylactic reactions.  Discussed strict return precautions.  Brandi Evans was evaluated in Emergency Department on 05/28/2020 for the symptoms described in the history of present illness. She was evaluated in the context of the global COVID-19 pandemic, which necessitated consideration that the patient might be at risk for infection with the SARS-CoV-2 virus that causes COVID-19. Institutional protocols and algorithms that pertain to the evaluation of patients at risk for COVID-19 are in a state of rapid change based on information released by regulatory bodies including the CDC and federal and state organizations. These policies and algorithms were followed during  the patient's care in the ED.  ____________________________________________   FINAL CLINICAL IMPRESSION(S) / ED DIAGNOSES  Anaphylaxis Allergic reaction   Harvest Dark, MD 05/28/20 2056

## 2020-05-28 NOTE — Discharge Instructions (Signed)
Please take your steroids for the next 5 days starting tomorrow 05/29/2020.  You may use Benadryl 50 mg (2 capsules) every 6 hours as needed for any itching or hives.  If you develop any swelling or difficulty breathing please administer an EpiPen injection and return immediately to the emergency department.

## 2020-05-28 NOTE — ED Triage Notes (Signed)
Pt arrives via [facility] from home with c/o allergic reaction following eating hazel nuts. Pt has severe eye swelling, stridor and itching per EMS upon their arrival.  Fire had given 1 epi and pt given another epi. Pt also given 125 solu medrol, Pepcid and albuterol treatment.  Pt states breathing better but eyes still very swollen.

## 2020-06-03 ENCOUNTER — Ambulatory Visit: Admit: 2020-06-03 | Payer: PPO | Admitting: Internal Medicine

## 2020-06-03 SURGERY — COLONOSCOPY WITH PROPOFOL
Anesthesia: General

## 2020-08-02 DIAGNOSIS — I10 Essential (primary) hypertension: Secondary | ICD-10-CM | POA: Diagnosis not present

## 2020-08-02 DIAGNOSIS — Z23 Encounter for immunization: Secondary | ICD-10-CM | POA: Diagnosis not present

## 2020-08-02 DIAGNOSIS — J4540 Moderate persistent asthma, uncomplicated: Secondary | ICD-10-CM | POA: Diagnosis not present

## 2020-08-02 DIAGNOSIS — E785 Hyperlipidemia, unspecified: Secondary | ICD-10-CM | POA: Diagnosis not present

## 2020-08-28 DIAGNOSIS — E785 Hyperlipidemia, unspecified: Secondary | ICD-10-CM | POA: Diagnosis not present

## 2020-08-28 DIAGNOSIS — H9319 Tinnitus, unspecified ear: Secondary | ICD-10-CM | POA: Diagnosis not present

## 2020-08-28 DIAGNOSIS — I10 Essential (primary) hypertension: Secondary | ICD-10-CM | POA: Diagnosis not present

## 2020-10-14 ENCOUNTER — Ambulatory Visit: Payer: Self-pay | Attending: Internal Medicine

## 2020-10-14 DIAGNOSIS — Z23 Encounter for immunization: Secondary | ICD-10-CM

## 2020-10-14 NOTE — Progress Notes (Signed)
   Covid-19 Vaccination Clinic  Name:  Brandi Evans    MRN: [id number] DOB: January 16, 1946  10/14/2020  Brandi Evans was observed post Covid-19 immunization for 15 minutes without incident. She was provided with Vaccine Information Sheet and instruction to access the V-Safe system.   Brandi Evans was instructed to call 911 with any severe reactions post vaccine: Marland Kitchen Difficulty breathing  . Swelling of face and throat  . A fast heartbeat  . A bad rash all over body  . Dizziness and weakness   Immunizations Administered    Name Date Dose VIS Date Route   Pfizer COVID-19 Vaccine 10/14/2020  1:10 PM 0.3 mL 07/31/2020 Intramuscular   Manufacturer: [employer], [employer]   Lot: [id number]   NDC: [address]-8

## 2020-11-15 DIAGNOSIS — J309 Allergic rhinitis, unspecified: Secondary | ICD-10-CM | POA: Diagnosis not present

## 2020-11-15 DIAGNOSIS — R6889 Other general symptoms and signs: Secondary | ICD-10-CM | POA: Diagnosis not present

## 2020-11-15 DIAGNOSIS — J4540 Moderate persistent asthma, uncomplicated: Secondary | ICD-10-CM | POA: Diagnosis not present

## 2021-01-29 DIAGNOSIS — H2512 Age-related nuclear cataract, left eye: Secondary | ICD-10-CM | POA: Diagnosis not present

## 2021-01-29 DIAGNOSIS — H53001 Unspecified amblyopia, right eye: Secondary | ICD-10-CM | POA: Diagnosis not present

## 2021-02-07 DIAGNOSIS — S3992XA Unspecified injury of lower back, initial encounter: Secondary | ICD-10-CM | POA: Diagnosis not present

## 2021-02-20 ENCOUNTER — Other Ambulatory Visit: Payer: Self-pay | Admitting: Family Medicine

## 2021-02-20 DIAGNOSIS — Z1231 Encounter for screening mammogram for malignant neoplasm of breast: Secondary | ICD-10-CM

## 2021-03-04 DIAGNOSIS — J4540 Moderate persistent asthma, uncomplicated: Secondary | ICD-10-CM | POA: Diagnosis not present

## 2021-03-04 DIAGNOSIS — I10 Essential (primary) hypertension: Secondary | ICD-10-CM | POA: Diagnosis not present

## 2021-03-04 DIAGNOSIS — Z86010 Personal history of colonic polyps: Secondary | ICD-10-CM | POA: Diagnosis not present

## 2021-04-15 DIAGNOSIS — I10 Essential (primary) hypertension: Secondary | ICD-10-CM | POA: Diagnosis not present

## 2021-04-15 DIAGNOSIS — M79675 Pain in left toe(s): Secondary | ICD-10-CM | POA: Diagnosis not present

## 2021-04-17 ENCOUNTER — Other Ambulatory Visit: Payer: Self-pay

## 2021-04-17 ENCOUNTER — Ambulatory Visit
Admission: RE | Admit: 2021-04-17 | Discharge: 2021-04-17 | Disposition: A | Discharge status: Home / Self Care | Payer: PPO | Source: Ambulatory Visit | Attending: Family Medicine | Admitting: Family Medicine

## 2021-04-17 DIAGNOSIS — Z1231 Encounter for screening mammogram for malignant neoplasm of breast: Secondary | ICD-10-CM

## 2021-04-18 DIAGNOSIS — M79675 Pain in left toe(s): Secondary | ICD-10-CM | POA: Diagnosis not present

## 2021-04-18 DIAGNOSIS — I10 Essential (primary) hypertension: Secondary | ICD-10-CM | POA: Diagnosis not present

## 2021-04-25 DIAGNOSIS — M79675 Pain in left toe(s): Secondary | ICD-10-CM | POA: Diagnosis not present

## 2021-04-25 DIAGNOSIS — I70202 Unspecified atherosclerosis of native arteries of extremities, left leg: Secondary | ICD-10-CM | POA: Diagnosis not present

## 2021-04-25 DIAGNOSIS — M19072 Primary osteoarthritis, left ankle and foot: Secondary | ICD-10-CM | POA: Diagnosis not present

## 2021-05-07 DIAGNOSIS — R2242 Localized swelling, mass and lump, left lower limb: Secondary | ICD-10-CM | POA: Diagnosis not present

## 2021-05-07 DIAGNOSIS — I10 Essential (primary) hypertension: Secondary | ICD-10-CM | POA: Diagnosis not present

## 2021-05-07 DIAGNOSIS — Z20822 Contact with and (suspected) exposure to covid-19: Secondary | ICD-10-CM | POA: Diagnosis not present

## 2021-05-07 DIAGNOSIS — M79672 Pain in left foot: Secondary | ICD-10-CM | POA: Diagnosis not present

## 2021-05-07 DIAGNOSIS — M79675 Pain in left toe(s): Secondary | ICD-10-CM | POA: Diagnosis not present

## 2021-05-09 DIAGNOSIS — H2512 Age-related nuclear cataract, left eye: Secondary | ICD-10-CM | POA: Diagnosis not present

## 2021-05-09 DIAGNOSIS — H26491 Other secondary cataract, right eye: Secondary | ICD-10-CM | POA: Diagnosis not present

## 2021-05-09 DIAGNOSIS — H25812 Combined forms of age-related cataract, left eye: Secondary | ICD-10-CM | POA: Diagnosis not present

## 2021-05-09 DIAGNOSIS — Z961 Presence of intraocular lens: Secondary | ICD-10-CM | POA: Diagnosis not present

## 2021-06-02 DIAGNOSIS — M79672 Pain in left foot: Secondary | ICD-10-CM | POA: Diagnosis not present

## 2021-06-02 DIAGNOSIS — M2012 Hallux valgus (acquired), left foot: Secondary | ICD-10-CM | POA: Diagnosis not present

## 2021-06-02 DIAGNOSIS — M216X1 Other acquired deformities of right foot: Secondary | ICD-10-CM | POA: Diagnosis not present

## 2021-06-02 DIAGNOSIS — M2042 Other hammer toe(s) (acquired), left foot: Secondary | ICD-10-CM | POA: Diagnosis not present

## 2021-06-02 DIAGNOSIS — M2141 Flat foot [pes planus] (acquired), right foot: Secondary | ICD-10-CM | POA: Diagnosis not present

## 2021-06-02 DIAGNOSIS — M205X2 Other deformities of toe(s) (acquired), left foot: Secondary | ICD-10-CM | POA: Diagnosis not present

## 2021-06-02 DIAGNOSIS — M2142 Flat foot [pes planus] (acquired), left foot: Secondary | ICD-10-CM | POA: Diagnosis not present

## 2021-06-02 DIAGNOSIS — M2011 Hallux valgus (acquired), right foot: Secondary | ICD-10-CM | POA: Diagnosis not present

## 2021-06-02 DIAGNOSIS — Z8739 Personal history of other diseases of the musculoskeletal system and connective tissue: Secondary | ICD-10-CM | POA: Diagnosis not present

## 2021-06-02 DIAGNOSIS — M216X2 Other acquired deformities of left foot: Secondary | ICD-10-CM | POA: Diagnosis not present

## 2021-06-02 DIAGNOSIS — M205X1 Other deformities of toe(s) (acquired), right foot: Secondary | ICD-10-CM | POA: Diagnosis not present

## 2021-06-02 DIAGNOSIS — M2041 Other hammer toe(s) (acquired), right foot: Secondary | ICD-10-CM | POA: Diagnosis not present

## 2021-06-03 ENCOUNTER — Ambulatory Visit: Admission: RE | Admit: 2021-06-03 | Payer: PPO | Source: Home / Self Care

## 2021-06-03 ENCOUNTER — Encounter: Admission: RE | Payer: Self-pay | Source: Home / Self Care

## 2021-06-03 SURGERY — COLONOSCOPY WITH PROPOFOL
Anesthesia: General

## 2021-06-06 ENCOUNTER — Encounter: Payer: Self-pay | Admitting: *Deleted

## 2021-06-09 ENCOUNTER — Other Ambulatory Visit: Payer: Self-pay

## 2021-06-09 ENCOUNTER — Encounter
Admission: RE | Disposition: A | Discharge status: Home / Self Care | Payer: Self-pay | Source: Home / Self Care | Attending: Gastroenterology

## 2021-06-09 ENCOUNTER — Ambulatory Visit
Admission: RE | Admit: 2021-06-09 | Discharge: 2021-06-09 | Disposition: A | Discharge status: Home / Self Care | Payer: PPO | Attending: Gastroenterology | Admitting: Gastroenterology

## 2021-06-09 ENCOUNTER — Encounter: Payer: Self-pay | Admitting: *Deleted

## 2021-06-09 ENCOUNTER — Ambulatory Visit: Payer: PPO | Admitting: Registered Nurse

## 2021-06-09 DIAGNOSIS — Z79899 Other long term (current) drug therapy: Secondary | ICD-10-CM | POA: Insufficient documentation

## 2021-06-09 DIAGNOSIS — Z86010 Personal history of colonic polyps: Secondary | ICD-10-CM | POA: Diagnosis not present

## 2021-06-09 DIAGNOSIS — K5730 Diverticulosis of large intestine without perforation or abscess without bleeding: Secondary | ICD-10-CM | POA: Insufficient documentation

## 2021-06-09 DIAGNOSIS — Z7982 Long term (current) use of aspirin: Secondary | ICD-10-CM | POA: Diagnosis not present

## 2021-06-09 DIAGNOSIS — Z7951 Long term (current) use of inhaled steroids: Secondary | ICD-10-CM | POA: Diagnosis not present

## 2021-06-09 DIAGNOSIS — K640 First degree hemorrhoids: Secondary | ICD-10-CM | POA: Insufficient documentation

## 2021-06-09 DIAGNOSIS — Z880 Allergy status to penicillin: Secondary | ICD-10-CM | POA: Insufficient documentation

## 2021-06-09 DIAGNOSIS — Z1211 Encounter for screening for malignant neoplasm of colon: Secondary | ICD-10-CM | POA: Diagnosis not present

## 2021-06-09 DIAGNOSIS — D124 Benign neoplasm of descending colon: Secondary | ICD-10-CM | POA: Insufficient documentation

## 2021-06-09 HISTORY — DX: Chronic obstructive pulmonary disease, unspecified: J44.9

## 2021-06-09 HISTORY — DX: Gout, unspecified: M10.9

## 2021-06-09 HISTORY — PX: COLONOSCOPY WITH PROPOFOL: SHX5780

## 2021-06-09 HISTORY — DX: Essential (primary) hypertension: I10

## 2021-06-09 SURGERY — COLONOSCOPY WITH PROPOFOL
Anesthesia: General

## 2021-06-09 MED ORDER — PROPOFOL 500 MG/50ML IV EMUL
INTRAVENOUS | Status: AC
  Filled 2021-06-09: qty 50

## 2021-06-09 MED ORDER — LIDOCAINE HCL (CARDIAC) PF 100 MG/5ML IV SOSY
PREFILLED_SYRINGE | INTRAVENOUS | Status: DC | PRN
  Administered 2021-06-09: 100 mg via INTRAVENOUS

## 2021-06-09 MED ORDER — PROPOFOL 500 MG/50ML IV EMUL
INTRAVENOUS | Status: DC | PRN
  Administered 2021-06-09: 150 ug/kg/min via INTRAVENOUS

## 2021-06-09 MED ORDER — SODIUM CHLORIDE 0.9 % IV SOLN: INTRAVENOUS | Status: DC

## 2021-06-09 MED ORDER — PROPOFOL 10 MG/ML IV BOLUS
INTRAVENOUS | Status: DC | PRN
  Administered 2021-06-09: 60 mg via INTRAVENOUS

## 2021-06-09 NOTE — Op Note (Signed)
[facility] Patient Name: Brandi Evans Procedure Date: 06/09/2021 10:21 AM MRN: [id number] Account #: [id number] Date of Birth: [birth year] Admit Type: Outpatient Age: 75 Room: [facility] ENDO ROOM 2 Gender: Female Note Status: Finalized Procedure:             Colonoscopy Indications:           High risk colon cancer surveillance: Personal history                         of colonic polyps Providers:             Rueben Bash, DO Referring MD:          Youlanda Roys. Lovie Macadamia, MD (Referring MD) Medicines:             Monitored Anesthesia Care Complications:         No immediate complications. Estimated blood loss:                         Minimal. Procedure:             Pre-Anesthesia Assessment:                        - Prior to the procedure, a History and Physical was                         performed, and patient medications and allergies were                         reviewed. The patient is competent. The risks and                         benefits of the procedure and the sedation options and                         risks were discussed with the patient. All questions                         were answered and informed consent was obtained.                         Patient identification and proposed procedure were                         verified by the physician, the nurse, the anesthetist                         and the technician in the endoscopy suite. Mental                         Status Examination: alert and oriented. Airway                         Examination: normal oropharyngeal airway and neck                         mobility. Respiratory Examination: clear to  auscultation. CV Examination: RRR, no murmurs, no S3                         or S4. Prophylactic Antibiotics: The patient does not                         require prophylactic antibiotics. Prior                         Anticoagulants: The patient  has taken no previous                         anticoagulant or antiplatelet agents except for                         aspirin. ASA Grade Assessment: II - A patient with                         mild systemic disease. After reviewing the risks and                         benefits, the patient was deemed in satisfactory                         condition to undergo the procedure. The anesthesia                         plan was to use monitored anesthesia care (MAC).                         Immediately prior to administration of medications,                         the patient was re-assessed for adequacy to receive                         sedatives. The heart rate, respiratory rate, oxygen                         saturations, blood pressure, adequacy of pulmonary                         ventilation, and response to care were monitored                         throughout the procedure. The physical status of the                         patient was re-assessed after the procedure.                        After obtaining informed consent, the colonoscope was                         passed under direct vision. Throughout the procedure,                         the patient's blood pressure, pulse, and oxygen  saturations were monitored continuously. The                         Colonoscope was introduced through the anus and                         advanced to the the cecum, identified by appendiceal                         orifice and ileocecal valve. The colonoscopy was                         performed without difficulty. The patient tolerated                         the procedure well. The ileocecal valve, appendiceal                         orifice, and rectum were photographed. The quality of                         the bowel preparation was evaluated using the BBPS                         [facility] Bowel Preparation Scale) with scores of: Right                         Colon =  3, Transverse Colon = 3 and Left Colon = 3                         (entire mucosa seen well with no residual staining,                         small fragments of stool or opaque liquid). The total                         BBPS score equals 9. Findings:      The perianal and digital rectal examinations were normal. Pertinent       negatives include normal sphincter tone.      A 2 to 3 mm polyp was found in the sigmoid colon. The polyp was sessile.       The polyp was removed with a cold biopsy forceps. Resection and       retrieval were complete. Estimated blood loss was minimal.      Non-bleeding internal hemorrhoids were found during retroflexion. The       hemorrhoids were Grade I (internal hemorrhoids that do not prolapse).      A few small-mouthed diverticula were found in the entire colon.       Estimated blood loss: none.      The exam was otherwise without abnormality on direct and retroflexion       views. Impression:            - One 2 to 3 mm polyp in the sigmoid colon, removed                         with a cold biopsy forceps. Resected and retrieved.                        -  Non-bleeding internal hemorrhoids.                        - Diverticulosis in the entire examined colon.                        - The examination was otherwise normal on direct and                         retroflexion views. Recommendation:        - Discharge patient to home.                        - Resume previous diet.                        - Continue present medications.                        - Await pathology results.                        - Further recommendations for repeat colonoscopy                         pending pathology, depending on time interval and                         health status at the time of potential repeat, no                         further screening/surveillance may be indicated Procedure Code(s):     --- Professional ---                        [phone], Colonoscopy,  flexible; with biopsy, single or                         multiple Diagnosis Code(s):     --- Professional ---                        Z86.010, Personal history of colonic polyps                        K63.5, Polyp of colon                        K64.0, First degree hemorrhoids                        K57.30, Diverticulosis of large intestine without                         perforation or abscess without bleeding CPT copyright 2019 American Medical Association. All rights reserved. The codes documented in this report are preliminary and upon coder review may  be revised to meet current compliance requirements. Attending Participation:      I personally performed the entire procedure. Volney American, DO [name] DO, DO 06/09/2021 11:18:52 AM This report has been signed electronically. Number of Addenda: 0 Note Initiated On: 06/09/2021 10:21 AM Scope Withdrawal Time: 0 hours 17 minutes 6  seconds  Total Procedure Duration: 0 hours 24 minutes 30 seconds  Estimated Blood Loss:  Estimated blood loss was minimal.      [facility]

## 2021-06-09 NOTE — Transfer of Care (Signed)
Immediate Anesthesia Transfer of Care Note  Patient: Brandi Evans  Procedure(s) Performed: COLONOSCOPY WITH PROPOFOL  Patient Location: Endoscopy Unit  Anesthesia Type:General  Level of Consciousness: drowsy  Airway & Oxygen Therapy: Patient Spontanous Breathing  Post-op Assessment: Report given to RN and Post -op Vital signs reviewed and stable  Post vital signs: Reviewed and stable  Last Vitals:  Vitals Value Taken Time  BP 120/76 06/09/21 1115  Temp    Pulse 71 06/09/21 1116  Resp 24 06/09/21 1116  SpO2 97 % 06/09/21 1116  Vitals shown include unvalidated device data.  Last Pain:  Vitals:   06/09/21 1012  TempSrc: Temporal  PainSc: 0-No pain         Complications: No notable events documented.

## 2021-06-09 NOTE — Anesthesia Preprocedure Evaluation (Signed)
Anesthesia Evaluation  Patient identified by MRN, date of birth, ID band Patient awake    Reviewed: Allergy & Precautions, H&P , NPO status , Patient's Chart, lab work & pertinent test results, reviewed documented beta blocker date and time   Airway Mallampati: II   Neck ROM: full    Dental  (+) Poor Dentition, Teeth Intact   Pulmonary asthma , COPD,  COPD inhaler,    Pulmonary exam normal        Cardiovascular Exercise Tolerance: Poor hypertension, On Medications negative cardio ROS Normal cardiovascular exam Rhythm:regular Rate:Normal     Neuro/Psych negative neurological ROS  negative psych ROS   GI/Hepatic negative GI ROS, Neg liver ROS,   Endo/Other  negative endocrine ROS  Renal/GU negative Renal ROS  negative genitourinary   Musculoskeletal   Abdominal   Peds  Hematology negative hematology ROS (+)   Anesthesia Other Findings Past Medical History: No date: Asthma No date: COPD (chronic obstructive pulmonary disease) (HCC) No date: Gout No date: Hypertension Past Surgical History: 1993: BREAST EXCISIONAL BIOPSY; Left     Comment:  neg No date: CESAREAN SECTION No date: COLONOSCOPY No date: COLONOSCOPY WITH PROPOFOL No date: ELBOW SURGERY BMI    Body Mass Index: 29.26 kg/m     Reproductive/Obstetrics negative OB ROS                             Anesthesia Physical Anesthesia Plan  ASA: 3  Anesthesia Plan: General   Post-op Pain Management:    Induction:   PONV Risk Score and Plan:   Airway Management Planned:   Additional Equipment:   Intra-op Plan:   Post-operative Plan:   Informed Consent: I have reviewed the patients History and Physical, chart, labs and discussed the procedure including the risks, benefits and alternatives for the proposed anesthesia with the patient or authorized representative who has indicated his/her understanding and acceptance.      Dental Advisory Given  Plan Discussed with: CRNA  Anesthesia Plan Comments:         Anesthesia Quick Evaluation

## 2021-06-09 NOTE — Interval H&P Note (Signed)
History and Physical Interval Note: Preprocedure H&P from 06/09/21  was reviewed and there was no interval change after seeing and examining the patient.  Written consent was obtained from the patient after discussion of risks, benefits, and alternatives. Patient has consented to proceed with Colonoscopy with possible intervention   06/09/2021 10:41 AM  Brandi Evans  has presented today for surgery, with the diagnosis of Hx of adenomatous polyp (z86.010).  The various methods of treatment have been discussed with the patient and family. After consideration of risks, benefits and other options for treatment, the patient has consented to  Procedure(s): COLONOSCOPY WITH PROPOFOL (N/A) as a surgical intervention.  The patient's history has been reviewed, patient examined, no change in status, stable for surgery.  I have reviewed the patient's chart and labs.  Questions were answered to the patient's satisfaction.     [name]

## 2021-06-09 NOTE — H&P (Signed)
[facility] Pre-Procedure H&P   Patient ID: Brandi Evans is a 75 y.o. female.  Gastroenterology Provider: [name], DO  Referring Provider: Laurine Blazer, PA PCP: Juluis Pitch, MD  Date: 06/09/2021  HPI Ms. Brandi Evans is a 75 y.o. female who presents today for Colonoscopy for personal history of colon polyps surveillance..  Pt reports normal BM w/o blood. Appetite and weight have been stable.  Colonoscopy in 2015 with diverticulosis, IH, and descending colon polyp (TA)  Patient denies nausea, vomiting, abdominal pain, diarrhea, constipation, melena, hematochezia, fever, chills.  Past Medical History:  Diagnosis Date   Asthma    COPD (chronic obstructive pulmonary disease) ([place])    Gout    Hypertension     Past Surgical History:  Procedure Laterality Date   BREAST EXCISIONAL BIOPSY Left 1993   neg   CESAREAN SECTION     COLONOSCOPY     COLONOSCOPY WITH PROPOFOL     ELBOW SURGERY      Family History No h/o GI disease or malignancy  Review of Systems  Constitutional:  Negative for activity change, appetite change, fatigue and fever.  HENT:  Negative for trouble swallowing and voice change.   Respiratory:  Negative for shortness of breath and wheezing.   Cardiovascular:  Negative for palpitations.  Gastrointestinal:  Negative for abdominal distention, abdominal pain, anal bleeding, blood in stool, constipation, diarrhea, nausea, rectal pain and vomiting.  Musculoskeletal:  Negative for arthralgias and myalgias.  Skin:  Negative for color change and pallor.  Neurological:  Negative for dizziness, syncope and weakness.  Psychiatric/Behavioral:  Negative for agitation and confusion.   All other systems reviewed and are negative.   Medications No current facility-administered medications on file prior to encounter.   Current Outpatient Medications on File Prior to Encounter  Medication Sig Dispense Refill   albuterol  (VENTOLIN HFA) 108 (90 Base) MCG/ACT inhaler Inhale into the lungs every 6 (six) hours as needed for wheezing or shortness of breath.     aspirin EC 81 MG tablet Take 81 mg by mouth daily. Swallow whole.     fluticasone-salmeterol (ADVAIR) 250-50 MCG/ACT AEPB Inhale 1 puff into the lungs in the morning and at bedtime.     lisinopril-hydrochlorothiazide (ZESTORETIC) 20-25 MG tablet Take 1 tablet by mouth daily.     metoprolol succinate (TOPROL-XL) 50 MG 24 hr tablet Take 50 mg by mouth daily. Take with or immediately following a meal.     EPINEPHrine (EPIPEN 2-PAK) 0.3 mg/0.3 mL IJ SOAJ injection Inject 0.3 mLs (0.3 mg total) into the muscle as needed for anaphylaxis. 1 each 1   predniSONE (DELTASONE) 20 MG tablet Take 2 tablets (40 mg total) by mouth daily. (Patient not taking: Reported on 06/09/2021) 10 tablet 0    Pertinent medications related to GI and procedure were reviewed by me with the patient prior to the procedure   Current Facility-Administered Medications:    0.9 %  sodium chloride infusion, , Intravenous, Continuous, [name], DO, Last Rate: 20 mL/hr at 06/09/21 1015, New Bag at 06/09/21 1015  sodium chloride 20 mL/hr at 06/09/21 1015       Allergies  Allergen Reactions   Other Anaphylaxis    Hazel nuts   Penicillins Hives   Allergies were reviewed by me prior to the procedure  Objective    Vitals:   06/09/21 1012  BP: (!) 152/76  Pulse: 74  Resp: 16  Temp: 97.7 F (36.5 C)  TempSrc: Temporal  SpO2: 99%  Weight: 72.6 kg  Height: '5\' 2"'$  (1.575 m)     Physical Exam Vitals reviewed.  Constitutional:      General: She is not in acute distress.    Appearance: Normal appearance. She is not ill-appearing, toxic-appearing or diaphoretic.  HENT:     Head: Normocephalic and atraumatic.     Nose: Nose normal.     Mouth/Throat:     Mouth: Mucous membranes are moist.     Pharynx: Oropharynx is clear.  Eyes:     General: No scleral icterus.     Extraocular Movements: Extraocular movements intact.  Cardiovascular:     Rate and Rhythm: Normal rate and regular rhythm.     Heart sounds: Normal heart sounds. No murmur heard.   No friction rub. No gallop.  Pulmonary:     Effort: Pulmonary effort is normal. No respiratory distress.     Breath sounds: Normal breath sounds. No wheezing, rhonchi or rales.  Abdominal:     General: Abdomen is flat. Bowel sounds are normal. There is no distension.     Palpations: Abdomen is soft.     Tenderness: There is no abdominal tenderness. There is no guarding or rebound.  Musculoskeletal:     Cervical back: Neck supple.     Right lower leg: No edema.     Left lower leg: No edema.  Skin:    General: Skin is warm and dry.     Coloration: Skin is not jaundiced or pale.  Neurological:     General: No focal deficit present.     Mental Status: She is alert and oriented to person, place, and time. Mental status is at baseline.  Psychiatric:        Mood and Affect: Mood normal.        Behavior: Behavior normal.        Thought Content: Thought content normal.        Judgment: Judgment normal.     Assessment:  Ms. Brandi Evans is a 75 y.o. female  who presents today for Colonoscopy for personal history of colon polyps surveillance.  Plan:  Colonoscopy with possible intervention today  Colonoscopy with possible biopsy, control of bleeding, polypectomy, and interventions as necessary has been discussed with the patient/patient representative. Informed consent was obtained from the patient/patient representative after explaining the indication, nature, and risks of the procedure including but not limited to death, bleeding, perforation, missed neoplasm/lesions, cardiorespiratory compromise, and reaction to medications. Opportunity for questions was given and appropriate answers were provided. Patient/patient representative has verbalized understanding is amenable to undergoing the  procedure.   [name], DO  [facility]  Portions of the record may have been created with voice recognition software. Occasional wrong-word or 'sound-a-like' substitutions may have occurred due to the inherent limitations of voice recognition software.  Read the chart carefully and recognize, using context, where substitutions may have occurred.

## 2021-06-10 ENCOUNTER — Encounter: Payer: Self-pay | Admitting: Gastroenterology

## 2021-06-10 LAB — SURGICAL PATHOLOGY

## 2021-06-10 NOTE — Anesthesia Postprocedure Evaluation (Signed)
Anesthesia Post Note  Patient: Brandi Evans  Procedure(s) Performed: COLONOSCOPY WITH PROPOFOL  Patient location during evaluation: PACU Anesthesia Type: General Level of consciousness: awake and alert Pain management: pain level controlled Vital Signs Assessment: post-procedure vital signs reviewed and stable Respiratory status: spontaneous breathing, nonlabored ventilation, respiratory function stable and patient connected to nasal cannula oxygen Cardiovascular status: blood pressure returned to baseline and stable Postop Assessment: no apparent nausea or vomiting Anesthetic complications: no   No notable events documented.   Last Vitals:  Vitals:   06/09/21 1135 06/09/21 1145  BP: 136/79 137/89  Pulse: 69 (!) 59  Resp: 20 16  Temp:    SpO2: 99% 100%    Last Pain:  Vitals:   06/10/21 0732  TempSrc:   PainSc: 0-No pain                 Molli Barrows

## 2021-08-11 DIAGNOSIS — Z9841 Cataract extraction status, right eye: Secondary | ICD-10-CM | POA: Diagnosis not present

## 2021-08-11 DIAGNOSIS — I10 Essential (primary) hypertension: Secondary | ICD-10-CM | POA: Diagnosis not present

## 2021-08-11 DIAGNOSIS — H25812 Combined forms of age-related cataract, left eye: Secondary | ICD-10-CM | POA: Diagnosis not present

## 2021-08-11 DIAGNOSIS — Z87891 Personal history of nicotine dependence: Secondary | ICD-10-CM | POA: Diagnosis not present

## 2021-08-11 DIAGNOSIS — E785 Hyperlipidemia, unspecified: Secondary | ICD-10-CM | POA: Diagnosis not present

## 2021-08-11 DIAGNOSIS — J449 Chronic obstructive pulmonary disease, unspecified: Secondary | ICD-10-CM | POA: Diagnosis not present

## 2021-08-15 DIAGNOSIS — R079 Chest pain, unspecified: Secondary | ICD-10-CM | POA: Diagnosis not present

## 2021-08-15 DIAGNOSIS — J90 Pleural effusion, not elsewhere classified: Secondary | ICD-10-CM | POA: Diagnosis not present

## 2021-08-15 DIAGNOSIS — R059 Cough, unspecified: Secondary | ICD-10-CM | POA: Diagnosis not present

## 2021-08-15 DIAGNOSIS — U071 COVID-19: Secondary | ICD-10-CM | POA: Diagnosis not present

## 2021-08-29 DIAGNOSIS — U071 COVID-19: Secondary | ICD-10-CM | POA: Diagnosis not present

## 2021-08-29 DIAGNOSIS — R059 Cough, unspecified: Secondary | ICD-10-CM | POA: Diagnosis not present

## 2021-08-29 DIAGNOSIS — J449 Chronic obstructive pulmonary disease, unspecified: Secondary | ICD-10-CM | POA: Diagnosis not present

## 2021-10-17 DIAGNOSIS — R059 Cough, unspecified: Secondary | ICD-10-CM | POA: Diagnosis not present

## 2021-11-17 DIAGNOSIS — M2041 Other hammer toe(s) (acquired), right foot: Secondary | ICD-10-CM | POA: Diagnosis not present

## 2021-11-17 DIAGNOSIS — M2141 Flat foot [pes planus] (acquired), right foot: Secondary | ICD-10-CM | POA: Diagnosis not present

## 2021-11-17 DIAGNOSIS — M216X1 Other acquired deformities of right foot: Secondary | ICD-10-CM | POA: Diagnosis not present

## 2021-11-17 DIAGNOSIS — M79672 Pain in left foot: Secondary | ICD-10-CM | POA: Diagnosis not present

## 2021-11-17 DIAGNOSIS — M2011 Hallux valgus (acquired), right foot: Secondary | ICD-10-CM | POA: Diagnosis not present

## 2021-11-17 DIAGNOSIS — M2042 Other hammer toe(s) (acquired), left foot: Secondary | ICD-10-CM | POA: Diagnosis not present

## 2021-11-17 DIAGNOSIS — M205X2 Other deformities of toe(s) (acquired), left foot: Secondary | ICD-10-CM | POA: Diagnosis not present

## 2021-11-17 DIAGNOSIS — D2372 Other benign neoplasm of skin of left lower limb, including hip: Secondary | ICD-10-CM | POA: Diagnosis not present

## 2021-11-17 DIAGNOSIS — M2012 Hallux valgus (acquired), left foot: Secondary | ICD-10-CM | POA: Diagnosis not present

## 2021-11-17 DIAGNOSIS — L84 Corns and callosities: Secondary | ICD-10-CM | POA: Diagnosis not present

## 2021-11-17 DIAGNOSIS — M205X1 Other deformities of toe(s) (acquired), right foot: Secondary | ICD-10-CM | POA: Diagnosis not present

## 2021-11-17 DIAGNOSIS — Z8739 Personal history of other diseases of the musculoskeletal system and connective tissue: Secondary | ICD-10-CM | POA: Diagnosis not present

## 2021-12-03 DIAGNOSIS — J309 Allergic rhinitis, unspecified: Secondary | ICD-10-CM | POA: Diagnosis not present

## 2021-12-03 DIAGNOSIS — Z0000 Encounter for general adult medical examination without abnormal findings: Secondary | ICD-10-CM | POA: Diagnosis not present

## 2021-12-03 DIAGNOSIS — E785 Hyperlipidemia, unspecified: Secondary | ICD-10-CM | POA: Diagnosis not present

## 2021-12-03 DIAGNOSIS — J4540 Moderate persistent asthma, uncomplicated: Secondary | ICD-10-CM | POA: Diagnosis not present

## 2021-12-03 DIAGNOSIS — M109 Gout, unspecified: Secondary | ICD-10-CM | POA: Diagnosis not present

## 2021-12-03 DIAGNOSIS — I10 Essential (primary) hypertension: Secondary | ICD-10-CM | POA: Diagnosis not present

## 2022-01-13 ENCOUNTER — Emergency Department: Payer: PPO

## 2022-01-13 ENCOUNTER — Other Ambulatory Visit: Payer: Self-pay

## 2022-01-13 ENCOUNTER — Emergency Department
Admission: EM | Admit: 2022-01-13 | Discharge: 2022-01-13 | Disposition: A | Discharge status: Home / Self Care | Payer: PPO | Attending: Emergency Medicine | Admitting: Emergency Medicine

## 2022-01-13 DIAGNOSIS — J301 Allergic rhinitis due to pollen: Secondary | ICD-10-CM | POA: Diagnosis not present

## 2022-01-13 DIAGNOSIS — J4520 Mild intermittent asthma, uncomplicated: Secondary | ICD-10-CM | POA: Diagnosis not present

## 2022-01-13 DIAGNOSIS — I517 Cardiomegaly: Secondary | ICD-10-CM | POA: Diagnosis not present

## 2022-01-13 DIAGNOSIS — M21612 Bunion of left foot: Secondary | ICD-10-CM | POA: Insufficient documentation

## 2022-01-13 DIAGNOSIS — R0602 Shortness of breath: Secondary | ICD-10-CM | POA: Diagnosis not present

## 2022-01-13 LAB — CBC
HCT: 40.6 % (ref 36.0–46.0)
Hemoglobin: 13.3 g/dL (ref 12.0–15.0)
MCH: 29.8 pg (ref 26.0–34.0)
MCHC: 32.8 g/dL (ref 30.0–36.0)
MCV: 90.8 fL (ref 80.0–100.0)
Platelets: 255 10*3/uL (ref 150–400)
RBC: 4.47 MIL/uL (ref 3.87–5.11)
RDW: 13.3 % (ref 11.5–15.5)
WBC: 5.4 10*3/uL (ref 4.0–10.5)
nRBC: 0 % (ref 0.0–0.2)

## 2022-01-13 LAB — BASIC METABOLIC PANEL
Anion gap: 12 (ref 5–15)
BUN: 25 mg/dL — ABNORMAL HIGH (ref 8–23)
CO2: 26 mmol/L (ref 22–32)
Calcium: 9.6 mg/dL (ref 8.9–10.3)
Chloride: 106 mmol/L (ref 98–111)
Creatinine, Ser: 0.98 mg/dL (ref 0.44–1.00)
GFR, Estimated: >60 mL/min (ref >60)
Glucose, Bld: 78 mg/dL (ref 70–99)
Potassium: 3.3 mmol/L — ABNORMAL LOW (ref 3.5–5.1)
Sodium: 144 mmol/L (ref 135–145)

## 2022-01-13 MED ORDER — PREDNISONE 50 MG PO TABS: 50.0000 mg | ORAL_TABLET | Freq: Every day | ORAL | 0 refills | Status: AC

## 2022-01-13 MED ORDER — CETIRIZINE HCL 10 MG PO TABS: 10.0000 mg | ORAL_TABLET | Freq: Every day | ORAL | 0 refills | Status: AC

## 2022-01-13 MED ORDER — MELOXICAM 7.5 MG PO TABS: 7.5000 mg | ORAL_TABLET | Freq: Every day | ORAL | 0 refills | Status: AC

## 2022-01-13 MED ORDER — ADVAIR DISKUS 250-50 MCG/ACT IN AEPB: 1.0000 | INHALATION_SPRAY | Freq: Two times a day (BID) | RESPIRATORY_TRACT | 3 refills | Status: AC

## 2022-01-13 NOTE — ED Notes (Signed)
Pt d/c home per MD order. Discharge summary reviewed with pt, pt verbalizes understanding. Ambulatory off unit. No s/s of acute distress noted at discharge.  °

## 2022-01-13 NOTE — ED Triage Notes (Signed)
Patient to the ER via POV with complaints of shortness of breath that started last night. Non-productive cough. States she has a hx of asthma and has been taking medications/ inhalers. Has ran out of her advair, and the generic brand doesn't seem to be working.  ?

## 2022-01-13 NOTE — ED Provider Notes (Signed)
? ?[facility] ?Provider Note ? ?Patient Contact: 6:00 PM (approximate) ? ? ?History  ? ?Shortness of Breath ? ? ?HPI ? ?Brandi Evans is a 76 y.o. female who presents the emergency department complaining of increased asthma symptoms.  Patient states that she is recently been transition to the generic of Advair due to insurance reasons.  She states that since then she has had some increased asthma symptoms with cough, shortness of breath.  She does have seasonal allergies, has been dealing more with allergies over the past several weeks.  She does not take any medications for her allergies.  She presents looking for a prescription for her original Advair.  She has no respiratory distress, no cough, no chest pain.  She is primarily concerned about increased asthma symptoms in general. ?  ? ? ?Physical Exam  ? ?Triage Vital Signs: ?ED Triage Vitals  ?Enc Vitals Group  ?   BP 01/13/22 1502 (!) 147/92  ?   Pulse Rate 01/13/22 1502 69  ?   Resp 01/13/22 1502 20  ?   Temp 01/13/22 1502 98.8 ?F (37.1 ?C)  ?   Temp Source 01/13/22 1502 Oral  ?   SpO2 01/13/22 1502 96 %  ?   Weight --   ?   Height 01/13/22 1503 '5\' 2"'$  (1.575 m)  ?   Head Circumference --   ?   Peak Flow --   ?   Pain Score 01/13/22 1503 0  ?   Pain Loc --   ?   Pain Edu? --   ?   Excl. in [place]? --   ? ? ?Most recent vital signs: ?Vitals:  ? 01/13/22 1502 01/13/22 1804  ?BP: (!) 147/92 (!) 142/70  ?Pulse: 69 65  ?Resp: 20 18  ?Temp: 98.8 ?F (37.1 ?C) 98.3 ?F (36.8 ?C)  ?SpO2: 96% 97%  ? ? ? ?General: Alert and in no acute distress. ?Eyes:  PERRL. EOMI. ?ENT: ?     Ears:  ?     Nose: No congestion/rhinnorhea. ?     Mouth/Throat: Mucous membranes are moist. ?Neck: No stridor. No cervical spine tenderness to palpation. ?Hematological/Lymphatic/Immunilogical: No cervical lymphadenopathy. ?Cardiovascular:  Good peripheral perfusion ?Respiratory: Normal respiratory effort without tachypnea or retractions. Lungs CTAB with no appreciable  wheezing, rales or rhonchi. Good air entry to the bases with no decreased or absent breath sounds. ?Musculoskeletal: Full range of motion to all extremities.  ?Neurologic:  No gross focal neurologic deficits are appreciated.  ?Skin:   No rash noted ?Other: ? ? ?ED Results / Procedures / Treatments  ? ?Labs ?(all labs ordered are listed, but only abnormal results are displayed) ?Labs Reviewed  ?BASIC METABOLIC PANEL - Abnormal; Notable for the following components:  ?    Result Value  ? Potassium 3.3 (*)   ? BUN 25 (*)   ? All other components within normal limits  ?CBC  ? ? ? ?EKG ? ? ? ? ?RADIOLOGY ? ?I personally viewed and evaluated these images as part of my medical decision making, as well as reviewing the written report by the radiologist. ? ?ED Provider Interpretation: Mild cardiomegaly but no change from previous imaging.  No other acute cardiopulmonary finding. ? ?DG Chest 2 View ? ?Result Date: 01/13/2022 ?CLINICAL DATA:  A 76 year old female presents for evaluation of shortness of breath. EXAM: CHEST - 2 VIEW COMPARISON:  September 26, 2011. FINDINGS: Trachea midline. Cardiomediastinal contours and hilar structures with persistent cardiac enlargement not  changed. No signs of lobar consolidation.  No evidence of pleural effusion. On limited assessment there is no acute skeletal process. IMPRESSION: Persistent cardiac enlargement without acute cardiopulmonary findings. Electronically Signed   By: Zetta Bills M.D.   On: 01/13/2022 15:41   ? ?PROCEDURES: ? ?Critical Care performed: No ? ?Procedures ? ? ?MEDICATIONS ORDERED IN ED: ?Medications - No data to display ? ? ?IMPRESSION / MDM / ASSESSMENT AND PLAN / ED COURSE  ?I reviewed the triage vital signs and the nursing notes. ?             ?               ? ?Differential diagnosis includes, but is not limited to, asthma exacerbation, allergic rhinitis, pneumonia, bronchitis ? ? ?Patient's diagnosis is consistent with asthma, allergic rhinitis.  Patient  presents to the emergency department complaining of increased asthma symptoms generally.  She is increased her cough and some shortness of breath.  She states that she was transition from [place] to the generic and has since had increased issues.  She states that she has also had some blistering in her mouth from the new medication.  She was not in any exacerbation, did have some increased allergic rhinitis symptoms as well.  At this time I will prescribe the patient the Advair brand, though I have cautioned if insurance is refusing to cover she may have to pay out-of-pocket for same.  Patient was also given short course of prednisone and allergy medications..  Labs, imaging is reassuring.  Patient will have prescriptions, follow-up primary care.  Return precautions discussed with the patient.  Patient is given ED precautions to return to the ED for any worsening or new symptoms. ? ? ? ?  ? ? ?FINAL CLINICAL IMPRESSION(S) / ED DIAGNOSES  ? ?Final diagnoses:  ?Mild intermittent asthma, unspecified whether complicated  ?Seasonal allergic rhinitis due to pollen  ?Bunion of left foot  ? ? ? ?Rx / DC Orders  ? ?ED Discharge Orders   ? ?      Ordered  ?  ADVAIR DISKUS 250-50 MCG/ACT AEPB  2 times daily       ? 01/13/22 1800  ?  predniSONE (DELTASONE) 50 MG tablet  Daily with breakfast       ? 01/13/22 1800  ?  cetirizine (ZYRTEC) 10 MG tablet  Daily       ? 01/13/22 1800  ?  meloxicam (MOBIC) 7.5 MG tablet  Daily       ? 01/13/22 1800  ? ?  ?  ? ?  ? ? ? ?Note:  This document was prepared using Dragon voice recognition software and may include unintentional dictation errors. ?  ?Darletta Moll, PA-C ?01/13/22 1826 ? ?  ?Vanessa Brownfields, MD ?01/14/22 0005 ? ?

## 2022-01-22 DIAGNOSIS — M7989 Other specified soft tissue disorders: Secondary | ICD-10-CM | POA: Diagnosis not present

## 2022-01-22 DIAGNOSIS — M109 Gout, unspecified: Secondary | ICD-10-CM | POA: Diagnosis not present

## 2022-01-29 DIAGNOSIS — E785 Hyperlipidemia, unspecified: Secondary | ICD-10-CM | POA: Diagnosis not present

## 2022-01-29 DIAGNOSIS — I10 Essential (primary) hypertension: Secondary | ICD-10-CM | POA: Diagnosis not present

## 2022-03-11 DIAGNOSIS — M79672 Pain in left foot: Secondary | ICD-10-CM | POA: Diagnosis not present

## 2022-03-11 DIAGNOSIS — Z8739 Personal history of other diseases of the musculoskeletal system and connective tissue: Secondary | ICD-10-CM | POA: Diagnosis not present

## 2022-03-11 DIAGNOSIS — M2141 Flat foot [pes planus] (acquired), right foot: Secondary | ICD-10-CM | POA: Diagnosis not present

## 2022-03-11 DIAGNOSIS — M2041 Other hammer toe(s) (acquired), right foot: Secondary | ICD-10-CM | POA: Diagnosis not present

## 2022-03-11 DIAGNOSIS — M205X2 Other deformities of toe(s) (acquired), left foot: Secondary | ICD-10-CM | POA: Diagnosis not present

## 2022-03-11 DIAGNOSIS — M216X1 Other acquired deformities of right foot: Secondary | ICD-10-CM | POA: Diagnosis not present

## 2022-03-11 DIAGNOSIS — M216X2 Other acquired deformities of left foot: Secondary | ICD-10-CM | POA: Diagnosis not present

## 2022-03-11 DIAGNOSIS — M205X1 Other deformities of toe(s) (acquired), right foot: Secondary | ICD-10-CM | POA: Diagnosis not present

## 2022-03-11 DIAGNOSIS — L851 Acquired keratosis [keratoderma] palmaris et plantaris: Secondary | ICD-10-CM | POA: Diagnosis not present

## 2022-03-11 DIAGNOSIS — M2012 Hallux valgus (acquired), left foot: Secondary | ICD-10-CM | POA: Diagnosis not present

## 2022-03-11 DIAGNOSIS — M2142 Flat foot [pes planus] (acquired), left foot: Secondary | ICD-10-CM | POA: Diagnosis not present

## 2022-03-11 DIAGNOSIS — M2011 Hallux valgus (acquired), right foot: Secondary | ICD-10-CM | POA: Diagnosis not present

## 2022-03-11 DIAGNOSIS — M79671 Pain in right foot: Secondary | ICD-10-CM | POA: Diagnosis not present

## 2022-03-11 DIAGNOSIS — M2042 Other hammer toe(s) (acquired), left foot: Secondary | ICD-10-CM | POA: Diagnosis not present

## 2022-03-13 ENCOUNTER — Other Ambulatory Visit: Payer: Self-pay | Admitting: Family Medicine

## 2022-03-13 DIAGNOSIS — Z1231 Encounter for screening mammogram for malignant neoplasm of breast: Secondary | ICD-10-CM

## 2022-05-21 ENCOUNTER — Emergency Department
Admission: EM | Admit: 2022-05-21 | Discharge: 2022-05-21 | Disposition: A | Discharge status: Home / Self Care | Payer: PPO | Attending: Emergency Medicine | Admitting: Emergency Medicine

## 2022-05-21 ENCOUNTER — Encounter: Payer: Self-pay | Admitting: Emergency Medicine

## 2022-05-21 ENCOUNTER — Other Ambulatory Visit: Payer: Self-pay

## 2022-05-21 ENCOUNTER — Emergency Department: Payer: PPO

## 2022-05-21 DIAGNOSIS — M79671 Pain in right foot: Secondary | ICD-10-CM | POA: Diagnosis not present

## 2022-05-21 DIAGNOSIS — M25571 Pain in right ankle and joints of right foot: Secondary | ICD-10-CM | POA: Diagnosis not present

## 2022-05-21 NOTE — ED Provider Triage Note (Signed)
Emergency Medicine Provider Triage Evaluation Note  Brandi Evans , a 76 y.o. female  was evaluated in triage.  Pt complains of right foot and ankle.  Sat on foot yesterday while on bed and had pain when she got up.  Took 2 ibuprofen last pm but this am pain is worse.    Review of Systems  Positive: Able to weight bear with cane and mild assistant.  Negative:   Physical Exam  Ht '5\' 2"'$  (1.575 m)   Wt 72.6 kg   BMI 29.27 kg/m  Gen:   Awake, no distress   Resp:  Normal effort  MSK:   Moderate tenderness in right foot and ankle.  No deformity.  Other:    Medical Decision Making  Medically screening exam initiated at 7:32 AM.  Appropriate orders placed.  Brandi Evans was informed that the remainder of the evaluation will be completed by another provider, this initial triage assessment does not replace that evaluation, and the importance of remaining in the ED until their evaluation is complete.     Brandi Hai, PA-C 05/21/22 [phone]

## 2022-05-21 NOTE — ED Notes (Signed)
Pt wheeled to waiting room. Pt verbalized understanding of discharge instructions.   

## 2022-05-21 NOTE — ED Triage Notes (Signed)
Presents with right foot pain since yesterday  Denies any fall  but states she did sit on her foot   no swelling noted  tenderness to touch

## 2022-05-21 NOTE — Discharge Instructions (Signed)
Please wear the Aircast.  You can take it off to shower or if you want to soak your ankle.  You can try ice 20 minutes every hour.  Make sure the ice is wrapped in a towel incisional get any frostbite.  Use Tylenol for the pain.  You can take 2 of the over-the-counter Motrin 3 times a day for about 2 days to see if that helps as well.  I do not want you to take a lot more because when you get older the Motrin can injure your kidneys or stomach.  Use the walker to help you get around.  Please follow-up with your podiatrist Dr. Luana Shu.  Give his office a call let him know that you hurt your ankle yesterday and see if he can follow-up with you.  Please do not hesitate to return if you have increasing pain or redness or swelling.  Especially return if you develop a fever.

## 2022-05-21 NOTE — ED Provider Notes (Signed)
[facility] Provider Note    Event Date/Time   First MD Initiated Contact with Patient 05/21/22 [phone]     (approximate)   History   Foot Pain (/)   HPI  Brandi Evans is a 77 y.o. female who reports she was doing fine yesterday she got a phone call sat down on her bed on her right foot and then after about 15 minutes she got up and started walking and developed sharp pain in the right side of the ankle.  Since then she has had a lot of pain while she attempts to walk.  The ankle is somewhat red and swollen.  She did not hear any popping or feeling a popping when she got up or when the pain started or at any other time.  She is not running a fever or having any other symptoms.  She does have some pain in the right heel just below where the redness and pain of the ankle is.      Physical Exam   Triage Vital Signs: ED Triage Vitals  Enc Vitals Group     BP 05/21/22 0743 137/87     Pulse Rate 05/21/22 0743 76     Resp 05/21/22 0743 18     Temp 05/21/22 0743 98 F (36.7 C)     Temp Source 05/21/22 0743 Oral     SpO2 05/21/22 0743 98 %     Weight 05/21/22 0728 160 lb 0.9 oz (72.6 kg)     Height 05/21/22 0728 '5\' 2"'$  (1.575 m)     Head Circumference --      Peak Flow --      Pain Score 05/21/22 0734 8     Pain Loc --      Pain Edu? --      Excl. in [place]? --     Most recent vital signs: Vitals:   05/21/22 0743  BP: 137/87  Pulse: 76  Resp: 18  Temp: 98 F (36.7 C)  SpO2: 98%     General: Awake, no distress.  CV:  Good peripheral perfusion.  Resp:  Normal effort.  Abd:  No distention.  Right lateral ankle is slightly red and tender.  There is some swelling present.  There is no increased warmth in the area.  The left ankle is normal the medial part of the right ankle is normal there is a little bit of pain on palpation of the heel as well but not as much.  There is no redness or swelling there.  Patient has no swelling in the distal foot and  good range of motion of the toes.  Moving the ankle somewhat tender.   ED Results / Procedures / Treatments   Labs (all labs ordered are listed, but only abnormal results are displayed) Labs Reviewed - No data to display   EKG     RADIOLOGY X-ray of the ankle shows some swelling mostly just below the lateral malleolus.  No obvious fractures.  I read and interpreted the film.  Radiology reads the film and agrees.   PROCEDURES:  Critical Care performed:   Procedures   MEDICATIONS ORDERED IN ED: Medications - No data to display   IMPRESSION / MDM / [place] / ED COURSE  I reviewed the triage vital signs and the nursing notes. The acuteness of the pain makes this sound like there is either an occult fracture or more likely an ankle sprain.  There is no  sign of any infection or gouty problems no past history of anything either.  I will treat her with a walker and an Aircast.  I have instructed her to follow-up with her podiatrist that she seen previously Dr. Luana Shu.  She will call the office today and try to schedule an appointment.  Will give her a walker and an air splint.  She will return here if the pain increases or if there is any increasing redness or any fever.  Patient will try to keep off the foot and keep it elevated is much as possible I told her she can put ice on it 20 minutes every hour or 2 but to make sure to limit the contact of the ice with the skin directly using a towel.  I do not want her to get frostbite she knows this. Differential diagnosis includes, but is not limited to, ankle sprain fracture that is occult and possibly even gout or infectious process but these are very less likely.  Patient's presentation is most consistent with acute problem but not life-threatening.       FINAL CLINICAL IMPRESSION(S) / ED DIAGNOSES   Final diagnoses:  Acute right ankle pain     Rx / DC Orders   ED Discharge Orders     None        Note:  This  document was prepared using Dragon voice recognition software and may include unintentional dictation errors.   Nena Polio, MD 05/21/22 [phone]

## 2022-05-23 ENCOUNTER — Encounter: Payer: Self-pay | Admitting: Radiology

## 2022-05-23 ENCOUNTER — Other Ambulatory Visit: Payer: Self-pay

## 2022-05-23 ENCOUNTER — Emergency Department: Payer: PPO

## 2022-05-23 ENCOUNTER — Emergency Department
Admission: EM | Admit: 2022-05-23 | Discharge: 2022-05-23 | Disposition: A | Discharge status: Home / Self Care | Payer: PPO | Attending: Emergency Medicine | Admitting: Emergency Medicine

## 2022-05-23 DIAGNOSIS — M79671 Pain in right foot: Secondary | ICD-10-CM

## 2022-05-23 DIAGNOSIS — Z7951 Long term (current) use of inhaled steroids: Secondary | ICD-10-CM | POA: Insufficient documentation

## 2022-05-23 DIAGNOSIS — E876 Hypokalemia: Secondary | ICD-10-CM | POA: Insufficient documentation

## 2022-05-23 DIAGNOSIS — Z7982 Long term (current) use of aspirin: Secondary | ICD-10-CM | POA: Insufficient documentation

## 2022-05-23 DIAGNOSIS — J449 Chronic obstructive pulmonary disease, unspecified: Secondary | ICD-10-CM | POA: Insufficient documentation

## 2022-05-23 DIAGNOSIS — Z79899 Other long term (current) drug therapy: Secondary | ICD-10-CM | POA: Diagnosis not present

## 2022-05-23 DIAGNOSIS — M25571 Pain in right ankle and joints of right foot: Secondary | ICD-10-CM | POA: Diagnosis not present

## 2022-05-23 DIAGNOSIS — M7731 Calcaneal spur, right foot: Secondary | ICD-10-CM | POA: Diagnosis not present

## 2022-05-23 DIAGNOSIS — M7989 Other specified soft tissue disorders: Secondary | ICD-10-CM | POA: Diagnosis not present

## 2022-05-23 DIAGNOSIS — J45909 Unspecified asthma, uncomplicated: Secondary | ICD-10-CM | POA: Diagnosis not present

## 2022-05-23 DIAGNOSIS — I10 Essential (primary) hypertension: Secondary | ICD-10-CM | POA: Diagnosis not present

## 2022-05-23 DIAGNOSIS — M19071 Primary osteoarthritis, right ankle and foot: Secondary | ICD-10-CM | POA: Diagnosis not present

## 2022-05-23 LAB — CBC WITH DIFFERENTIAL/PLATELET
Abs Immature Granulocytes: 0.01 10*3/uL (ref 0.00–0.07)
Basophils Absolute: 0 10*3/uL (ref 0.0–0.1)
Basophils Relative: 1 %
Eosinophils Absolute: 0.2 10*3/uL (ref 0.0–0.5)
Eosinophils Relative: 3 %
HCT: 41.6 % (ref 36.0–46.0)
Hemoglobin: 13.6 g/dL (ref 12.0–15.0)
Immature Granulocytes: 0 %
Lymphocytes Relative: 34 %
Lymphs Abs: 2.3 10*3/uL (ref 0.7–4.0)
MCH: 29.8 pg (ref 26.0–34.0)
MCHC: 32.7 g/dL (ref 30.0–36.0)
MCV: 91 fL (ref 80.0–100.0)
Monocytes Absolute: 0.4 10*3/uL (ref 0.1–1.0)
Monocytes Relative: 7 %
Neutro Abs: 3.7 10*3/uL (ref 1.7–7.7)
Neutrophils Relative %: 55 %
Platelets: 286 10*3/uL (ref 150–400)
RBC: 4.57 MIL/uL (ref 3.87–5.11)
RDW: 13.2 % (ref 11.5–15.5)
WBC: 6.7 10*3/uL (ref 4.0–10.5)
nRBC: 0 % (ref 0.0–0.2)

## 2022-05-23 LAB — COMPREHENSIVE METABOLIC PANEL
ALT: 51 U/L — ABNORMAL HIGH (ref 0–44)
AST: 54 U/L — ABNORMAL HIGH (ref 15–41)
Albumin: 3.9 g/dL (ref 3.5–5.0)
Alkaline Phosphatase: 78 U/L (ref 38–126)
Anion gap: 9 (ref 5–15)
BUN: 25 mg/dL — ABNORMAL HIGH (ref 8–23)
CO2: 26 mmol/L (ref 22–32)
Calcium: 9.3 mg/dL (ref 8.9–10.3)
Chloride: 104 mmol/L (ref 98–111)
Creatinine, Ser: 0.94 mg/dL (ref 0.44–1.00)
GFR, Estimated: >60 mL/min (ref >60)
Glucose, Bld: 109 mg/dL — ABNORMAL HIGH (ref 70–99)
Potassium: 3.3 mmol/L — ABNORMAL LOW (ref 3.5–5.1)
Sodium: 139 mmol/L (ref 135–145)
Total Bilirubin: 0.6 mg/dL (ref 0.3–1.2)
Total Protein: 7.9 g/dL (ref 6.5–8.1)

## 2022-05-23 MED ORDER — POTASSIUM CHLORIDE CRYS ER 20 MEQ PO TBCR
10.0000 meq | EXTENDED_RELEASE_TABLET | Freq: Once | ORAL | Status: AC
  Administered 2022-05-23: 10 meq via ORAL
  Filled 2022-05-23: qty 1

## 2022-05-23 NOTE — ED Notes (Signed)
See triage note. Swelling, slight redness and slightly inc in heat to R foot and ankle compared to L foot and ankle. Movement of R foot limited. Pulse at L foot 2+; pulse at R foot audible upon doppler. Pt denies inc difficulty of breathing. Pt's resp reg/unlabored, skin dry and laying calmly on stretcher.

## 2022-05-23 NOTE — Discharge Instructions (Addendum)
You have been seen today in the emergency room for right foot/ankle pain. I recommend that you wear the air splint that was prescribed to you on the 10th.  I also recommend that you call your podiatrist on Monday morning and make a follow-up appointment. Otherwise you should rest/elevate your foot is much as possible and take Tylenol or ibuprofen for the discomfort.

## 2022-05-23 NOTE — ED Provider Notes (Signed)
[facility] Emergency Department Provider Note   ____________________________________________   Event Date/Time   First MD Initiated Contact with Patient 05/23/22 1938     (approximate)  I have reviewed the triage vital signs and the nursing notes.   HISTORY  Chief Complaint Foot Pain    HPI Brandi Evans is a 76 y.o. female presents to the emergency room today with complaint of right lateral ankle pain.   Patient was seen in the emergency room on 05/21/2022.  At that time an x-ray of the right foot was obtained and showed no acute abnormality.  An x-ray of the right ankle was also obtained and showed no fracture with moderate amount of soft tissue swelling. Patient was discharged with air splint and was to follow-up with podiatry. Today patient reports that she has not been wearing an air splint and that she did not follow-up with podiatry as recommended.  Patient reports that she continues with right lateral ankle pain and swelling to the entire right foot. She denies any falls or injury.  She denies any history of gout. She reports that she is still having a significant amount of pain with bearing weight/ambulating. On exam patient's foot does have 2+ swelling that extends into the ankle but nonpitting.  There is minimal redness noted to the top of foot.  However, there is no warmth to the foot or ankle. Patient is tender to palpation over the lateral ankle only. Patient has good range of motion with no pain reported. Patient has good pulses.  Patient reports that the pain ranges from a 3-8 out of 10.  She reports that she has not taken anything for the pain currently.   Past Medical History:  Diagnosis Date   Asthma    COPD (chronic obstructive pulmonary disease) ([place])    Gout    Hypertension     There are no problems to display for this patient.   Past Surgical History:  Procedure Laterality Date   BREAST EXCISIONAL BIOPSY Left  1993   neg   CESAREAN SECTION     COLONOSCOPY     COLONOSCOPY WITH PROPOFOL     COLONOSCOPY WITH PROPOFOL N/A 06/09/2021   Procedure: COLONOSCOPY WITH PROPOFOL;  Surgeon: [name], DO;  Location: [facility] ENDOSCOPY;  Service: Gastroenterology;  Laterality: N/A;   ELBOW SURGERY      Prior to Admission medications   Medication Sig Start Date End Date Taking? Authorizing Provider  ADVAIR DISKUS 250-50 MCG/ACT AEPB Inhale 1 puff into the lungs in the morning and at bedtime. 01/13/22   Cuthriell, Charline Bills, PA-C  albuterol (VENTOLIN HFA) 108 (90 Base) MCG/ACT inhaler Inhale into the lungs every 6 (six) hours as needed for wheezing or shortness of breath.    [provider]  aspirin EC 81 MG tablet Take 81 mg by mouth daily. Swallow whole.    [provider]  cetirizine (ZYRTEC) 10 MG tablet Take 1 tablet (10 mg total) by mouth daily. 01/13/22   Cuthriell, Charline Bills, PA-C  EPINEPHrine (EPIPEN 2-PAK) 0.3 mg/0.3 mL IJ SOAJ injection Inject 0.3 mLs (0.3 mg total) into the muscle as needed for anaphylaxis. 05/28/20   Harvest Dark, MD  fluticasone-salmeterol (ADVAIR) 250-50 MCG/ACT AEPB Inhale 1 puff into the lungs in the morning and at bedtime.    [provider]  lisinopril-hydrochlorothiazide (ZESTORETIC) 20-25 MG tablet Take 1 tablet by mouth daily.    [provider]  meloxicam (MOBIC) 7.5 MG tablet Take  1 tablet (7.5 mg total) by mouth daily. 01/13/22 01/13/23  Cuthriell, Charline Bills, PA-C  metoprolol succinate (TOPROL-XL) 50 MG 24 hr tablet Take 50 mg by mouth daily. Take with or immediately following a meal.    [provider]  predniSONE (DELTASONE) 50 MG tablet Take 1 tablet (50 mg total) by mouth daily with breakfast. 01/13/22   Cuthriell, Charline Bills, PA-C    Allergies Other and Penicillins  No family history on file.  Social History Social History   Tobacco Use   Smoking status: Never   Smokeless tobacco: Never  Vaping Use   Vaping  Use: Never used  Substance Use Topics   Alcohol use: Yes    Alcohol/week: 1.0 standard drink of alcohol    Types: 1 Glasses of wine per week    Comment: weekly   Drug use: No    Review of Systems  Constitutional: No fever/chills Eyes: No visual changes. ENT: No sore throat. Cardiovascular: Denies chest pain. Respiratory: Denies shortness of breath. Gastrointestinal: No abdominal pain.  No nausea, no vomiting.  No diarrhea.  No constipation. Genitourinary: Negative for dysuria. Musculoskeletal: Negative for back pain.  Right foot/ankle pain Skin: Negative for rash. Neurological: Negative for headaches, focal weakness or numbness.   ____________________________________________   PHYSICAL EXAM:  VITAL SIGNS: ED Triage Vitals  Enc Vitals Group     BP 05/23/22 1915 (!) 123/93     Pulse Rate 05/23/22 1915 77     Resp 05/23/22 1915 18     Temp 05/23/22 1915 98.2 F (36.8 C)     Temp Source 05/23/22 1915 Oral     SpO2 05/23/22 1915 96 %     Weight 05/23/22 1916 158 lb 11.7 oz (72 kg)     Height 05/23/22 1916 '5\' 2"'$  (1.575 m)     Head Circumference --      Peak Flow --      Pain Score 05/23/22 1916 8     Pain Loc --      Pain Edu? --      Excl. in [place]? --     Constitutional: Alert and oriented. Well appearing and in no acute distress. Eyes: Conjunctivae are normal. PERRL. EOMI. Head: Atraumatic. Nose: No congestion/rhinnorhea. Mouth/Throat: Mucous membranes are moist.  Oropharynx non-erythematous. Neck: No stridor.   Cardiovascular: Normal rate, regular rhythm. Grossly normal heart sounds.  Good peripheral circulation. Respiratory: Normal respiratory effort.  No retractions. Lungs CTAB. Gastrointestinal: Soft and nontender. No distention. No abdominal bruits. No CVA tenderness. Musculoskeletal: Patient has 2+ nonpitting swelling to right foot/ankle.  There is minimal redness to the top of the right foot.  There is no warmth to the foot or ankle.  There is minimal pain  with palpation to the lateral ankle.  Patient has good range of motion.  Pulses are normal. Neurologic:  Normal speech and language. No gross focal neurologic deficits are appreciated. No gait instability. Skin:  Skin is warm, dry and intact. No rash noted. Psychiatric: Mood and affect are normal. Speech and behavior are normal.  ____________________________________________   LABS (all labs ordered are listed, but only abnormal results are displayed)  Labs Reviewed  COMPREHENSIVE METABOLIC PANEL - Abnormal; Notable for the following components:      Result Value   Potassium 3.3 (*)    Glucose, Bld 109 (*)    BUN 25 (*)    AST 54 (*)    ALT 51 (*)    All other components within normal limits  CBC WITH DIFFERENTIAL/PLATELET   ____________________________________________  EKG   ____________________________________________  RADIOLOGY  ED MD interpretation: X-ray of right ankle and right foot were reviewed by me and read by radiologist.  Official radiology report(s): DG Ankle Complete Right  Result Date: 05/23/2022 CLINICAL DATA:  Persistent ankle pain and swelling, initial encounter EXAM: RIGHT ANKLE - COMPLETE 3+ VIEW COMPARISON:  05/21/2022 FINDINGS: Persistent soft tissue swelling is noted laterally. No acute fracture or dislocation is noted. Calcaneal spurring is seen. Mild tibiotalar degenerative changes are seen. IMPRESSION: Soft tissue swelling laterally without acute bony abnormality. Electronically Signed   By: Inez Catalina M.D.   On: 05/23/2022 20:19   DG Foot Complete Right  Result Date: 05/23/2022 CLINICAL DATA:  Foot pain and swelling, initial encounter EXAM: RIGHT FOOT COMPLETE - 3+ VIEW COMPARISON:  05/21/2022 FINDINGS: Hallux valgus deformity is again noted. No acute fracture or dislocation is seen. Slight increased soft tissue swelling is noted along the distal metatarsals. IMPRESSION: Slight increase in soft tissue swelling without acute bony abnormality.  Electronically Signed   By: Inez Catalina M.D.   On: 05/23/2022 20:05    ____________________________________________   PROCEDURES  Procedure(s) performed: None  Procedures  Critical Care performed: No  ____________________________________________   INITIAL IMPRESSION / ASSESSMENT AND PLAN / ED COURSE     Brandi Evans is a 76 y.o. female presents to the emergency room today with complaint of right lateral ankle pain.   Patient was seen in the emergency room on 05/21/2022.  At that time an x-ray of the right foot was obtained and showed no acute abnormality.  An x-ray of the right ankle was also obtained and showed no fracture with moderate amount of soft tissue swelling. Patient was discharged with air splint and was to follow-up with podiatry. Today patient reports that she has not been wearing an air splint and that she did not follow-up with podiatry as recommended.  Patient reports that she continues with right lateral ankle pain and swelling to the entire right foot. She denies any falls or injury.  She denies any history of gout. She reports that she is still having a significant amount of pain with bearing weight/ambulating. On exam patient's foot does have 2+ swelling that extends into the ankle but nonpitting.  There is minimal redness noted to the top of foot.  However, there is no warmth to the foot or ankle. Patient is tender to palpation over the lateral ankle only. Patient has good range of motion with no pain reported. Patient has good pulses.  Patient reports that the pain ranges from a 3-8 out of 10.  She reports that she has not taken anything for the pain currently.  Triage staff ordered x-ray of right ankle/right foot/CBC/CMP.  X-ray of right foot and right ankle show soft tissue swelling with no bony abnormality or fracture noted. CBC is reassuring for no infectious process. CM P is reassuring with only slight decrease in potassium level at 3.3 and  slight elevation in BUN of 25.   Her liver enzymes are only slightly elevated.  She should follow-up with her PCP to consider further evaluation of this.  Today I will prescribe patient 1 dose of potassium while in the emergency room. I discussed with her foods that are rich in potassium.  She should start to wear her air splint as discussed with her at previous ED visit. She should contact her podiatrist to make follow-up appointment first thing on Monday morning.  Will  be discharged home in stable condition at this time.     ____________________________________________   FINAL CLINICAL IMPRESSION(S) / ED DIAGNOSES  Final diagnoses:  Right foot pain  Acute right ankle pain     ED Discharge Orders     None        Note:  This document was prepared using Dragon voice recognition software and may include unintentional dictation errors.     Willaim Rayas, NP 05/23/22 2041    Naaman Plummer, MD 05/24/22 [phone]

## 2022-05-23 NOTE — ED Notes (Signed)
Pt reports R foot pain that goes from the top of the foot and extends laterally through the ankle. Pt reports this pain began five days ago after sitting on foot. Pt reports she was seen yesterday - given an ankle splint that she has been noncompliant with.

## 2022-05-23 NOTE — ED Triage Notes (Signed)
Pt states is having continued pain to right foot with associated swelling now. Pt denies fever, slight redness noted to foot.

## 2022-06-03 DIAGNOSIS — M25371 Other instability, right ankle: Secondary | ICD-10-CM | POA: Diagnosis not present

## 2022-06-03 DIAGNOSIS — S93491A Sprain of other ligament of right ankle, initial encounter: Secondary | ICD-10-CM | POA: Diagnosis not present

## 2022-06-03 DIAGNOSIS — M2041 Other hammer toe(s) (acquired), right foot: Secondary | ICD-10-CM | POA: Diagnosis not present

## 2022-06-03 DIAGNOSIS — L84 Corns and callosities: Secondary | ICD-10-CM | POA: Diagnosis not present

## 2022-06-03 DIAGNOSIS — Z8739 Personal history of other diseases of the musculoskeletal system and connective tissue: Secondary | ICD-10-CM | POA: Diagnosis not present

## 2022-06-03 DIAGNOSIS — M2012 Hallux valgus (acquired), left foot: Secondary | ICD-10-CM | POA: Diagnosis not present

## 2022-06-03 DIAGNOSIS — M205X1 Other deformities of toe(s) (acquired), right foot: Secondary | ICD-10-CM | POA: Diagnosis not present

## 2022-06-03 DIAGNOSIS — M2141 Flat foot [pes planus] (acquired), right foot: Secondary | ICD-10-CM | POA: Diagnosis not present

## 2022-06-03 DIAGNOSIS — M2011 Hallux valgus (acquired), right foot: Secondary | ICD-10-CM | POA: Diagnosis not present

## 2022-06-03 DIAGNOSIS — M25571 Pain in right ankle and joints of right foot: Secondary | ICD-10-CM | POA: Diagnosis not present

## 2022-06-03 DIAGNOSIS — M216X1 Other acquired deformities of right foot: Secondary | ICD-10-CM | POA: Diagnosis not present

## 2022-06-03 DIAGNOSIS — M79671 Pain in right foot: Secondary | ICD-10-CM | POA: Diagnosis not present

## 2022-06-23 ENCOUNTER — Ambulatory Visit
Admission: RE | Admit: 2022-06-23 | Discharge: 2022-06-23 | Disposition: A | Discharge status: Home / Self Care | Payer: PPO | Source: Ambulatory Visit | Attending: Family Medicine | Admitting: Family Medicine

## 2022-06-23 DIAGNOSIS — Z1231 Encounter for screening mammogram for malignant neoplasm of breast: Secondary | ICD-10-CM | POA: Diagnosis not present

## 2022-06-26 DIAGNOSIS — M19032 Primary osteoarthritis, left wrist: Secondary | ICD-10-CM | POA: Diagnosis not present

## 2022-06-26 DIAGNOSIS — M25432 Effusion, left wrist: Secondary | ICD-10-CM | POA: Diagnosis not present

## 2022-06-26 DIAGNOSIS — M7989 Other specified soft tissue disorders: Secondary | ICD-10-CM | POA: Diagnosis not present

## 2022-06-26 DIAGNOSIS — M19042 Primary osteoarthritis, left hand: Secondary | ICD-10-CM | POA: Diagnosis not present

## 2022-07-07 DIAGNOSIS — M7989 Other specified soft tissue disorders: Secondary | ICD-10-CM | POA: Diagnosis not present

## 2022-07-09 ENCOUNTER — Other Ambulatory Visit: Payer: Self-pay | Admitting: Physician Assistant

## 2022-07-09 DIAGNOSIS — M7989 Other specified soft tissue disorders: Secondary | ICD-10-CM

## 2022-07-23 ENCOUNTER — Ambulatory Visit: Admission: RE | Admit: 2022-07-23 | Payer: PPO | Source: Ambulatory Visit

## 2022-07-29 ENCOUNTER — Ambulatory Visit: Admission: RE | Admit: 2022-07-29 | Payer: PPO | Source: Ambulatory Visit

## 2022-08-20 IMAGING — MG MM DIGITAL SCREENING BILAT W/ TOMO AND CAD
8 series · 8 of 24 positions shown · non-contrast
Comparison: Previous exam(s).

CLINICAL DATA: Screening.

EXAM:
DIGITAL SCREENING BILATERAL MAMMOGRAM WITH TOMOSYNTHESIS AND CAD
TECHNIQUE: Bilateral screening digital craniocaudal and mediolateral oblique
mammograms were obtained. Bilateral screening digital breast
tomosynthesis was performed. The images were evaluated with
computer-aided detection.

[L MLO synth-2D]
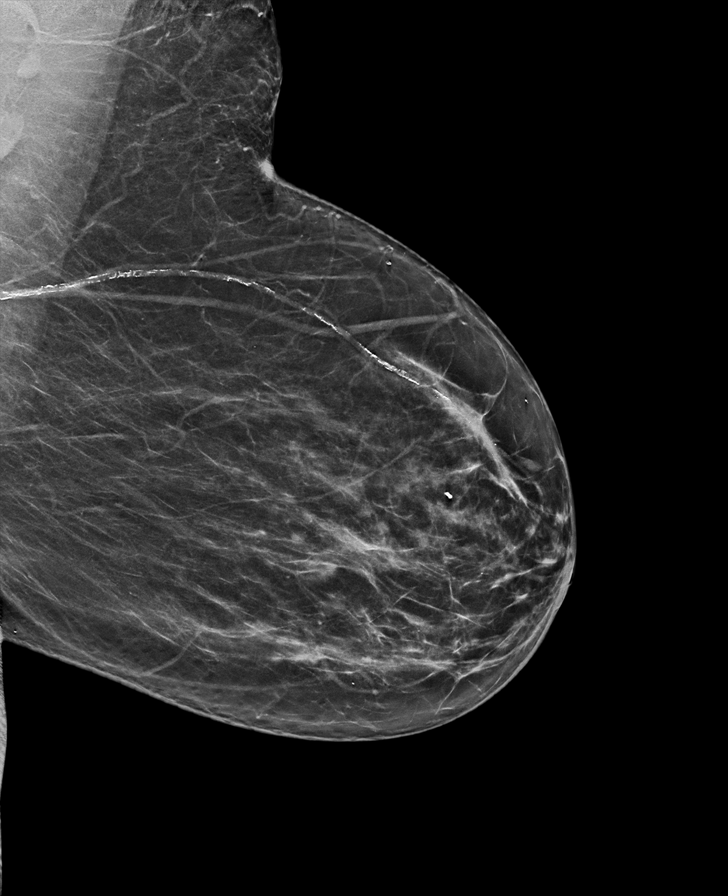

[R MLO synth-2D]
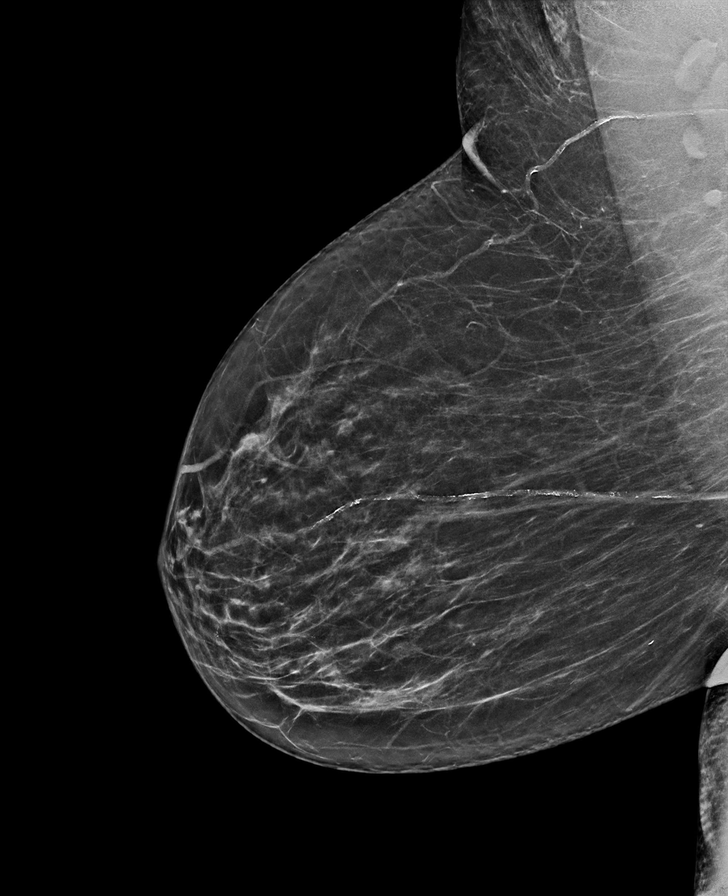

[L CC synth-2D]
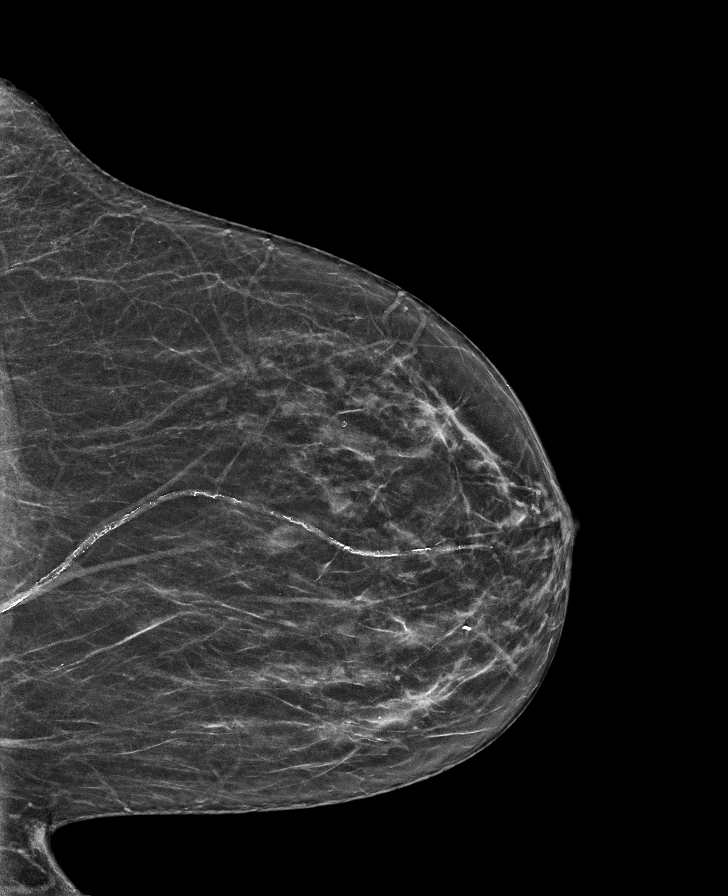

[R CC synth-2D]
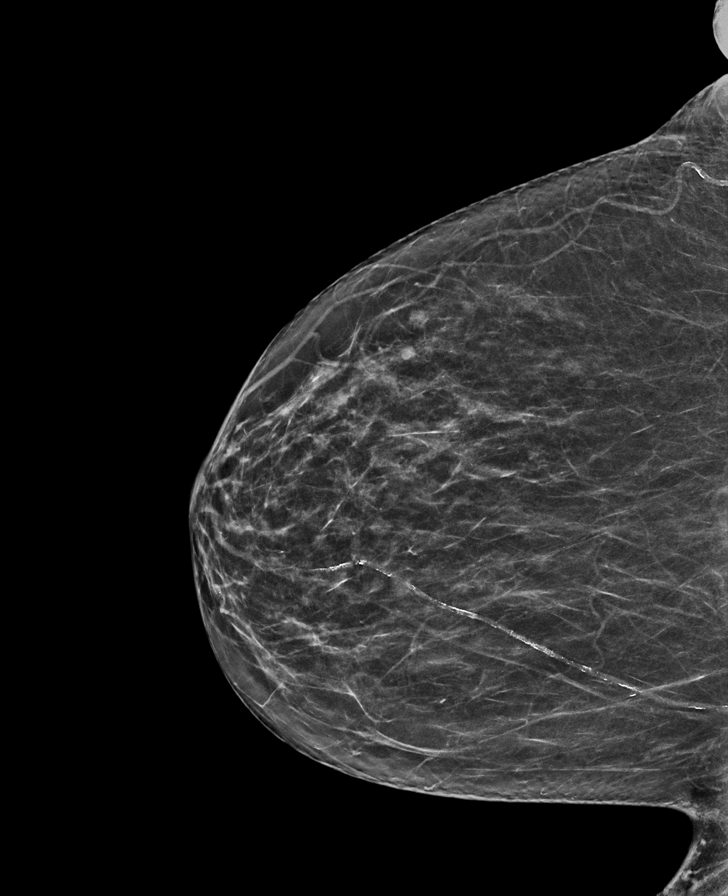

[R MLO tomo · tomo slice 33/64.0]
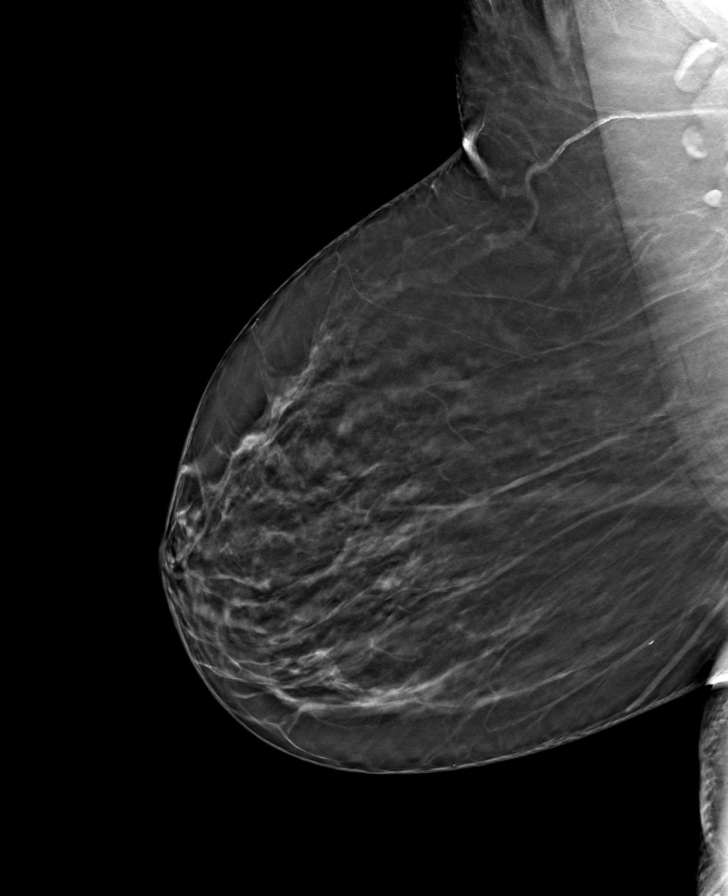

[R CC tomo · tomo slice 29/58.0]
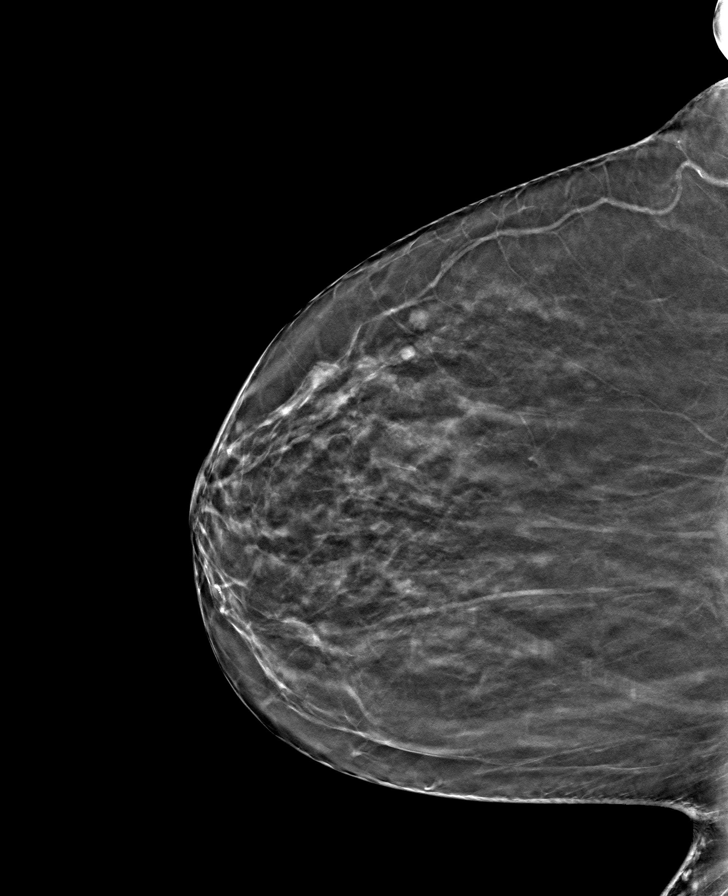

[L MLO tomo · tomo slice 33/66.0]
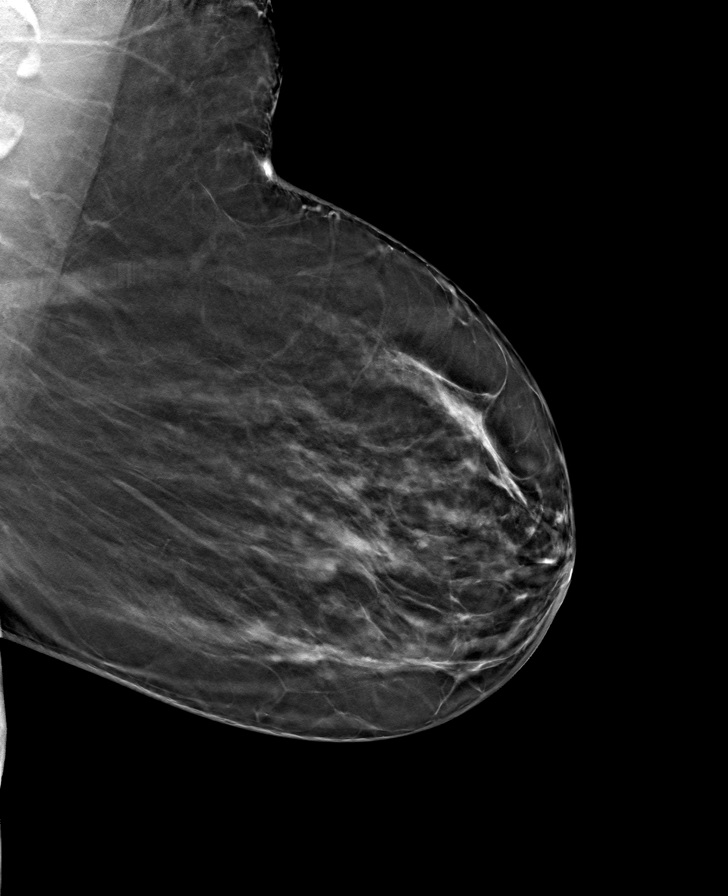

[L CC tomo · tomo slice 30/59.0]
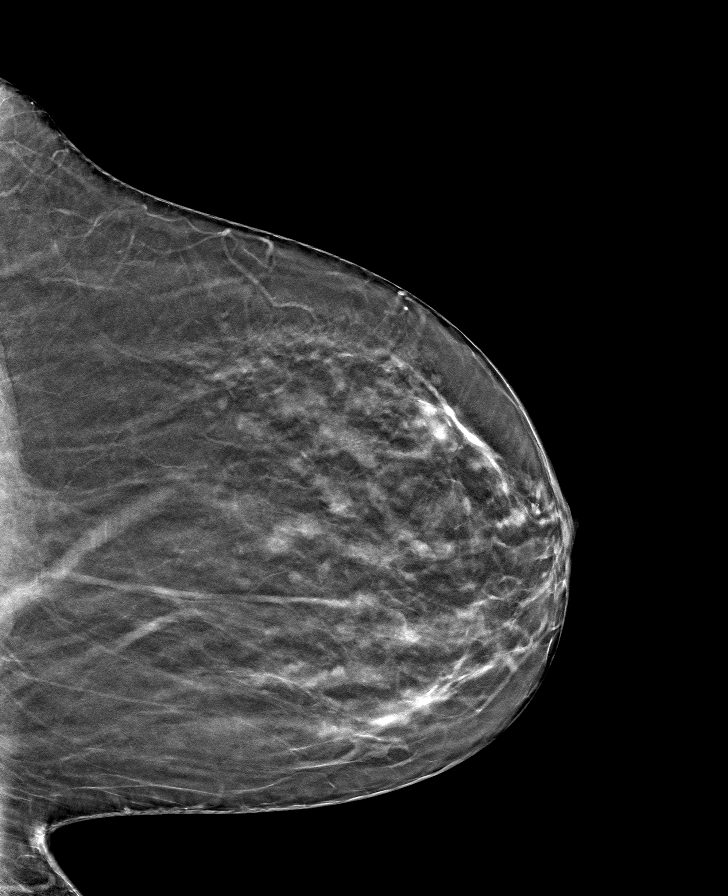

[8 of 24 positions shown; findings below may reference images not displayed]

ACR Breast Density Category b: There are scattered areas of
fibroglandular density.
FINDINGS: There are no findings suspicious for malignancy.
IMPRESSION: No mammographic evidence of malignancy. A result letter of this
screening mammogram will be mailed directly to the patient.

RECOMMENDATION:
Screening mammogram in one year. (Code:51-O-LD2)

BI-RADS CATEGORY  1: Negative.

## 2022-09-26 ENCOUNTER — Emergency Department
Admission: EM | Admit: 2022-09-26 | Discharge: 2022-09-26 | Disposition: A | Discharge status: Home / Self Care | Payer: PPO | Attending: Emergency Medicine | Admitting: Emergency Medicine

## 2022-09-26 ENCOUNTER — Emergency Department: Payer: PPO

## 2022-09-26 ENCOUNTER — Other Ambulatory Visit: Payer: Self-pay

## 2022-09-26 DIAGNOSIS — M109 Gout, unspecified: Secondary | ICD-10-CM | POA: Diagnosis not present

## 2022-09-26 DIAGNOSIS — M79675 Pain in left toe(s): Secondary | ICD-10-CM | POA: Diagnosis present

## 2022-09-26 DIAGNOSIS — M19072 Primary osteoarthritis, left ankle and foot: Secondary | ICD-10-CM | POA: Diagnosis not present

## 2022-09-26 DIAGNOSIS — M2012 Hallux valgus (acquired), left foot: Secondary | ICD-10-CM | POA: Diagnosis not present

## 2022-09-26 MED ORDER — COLCHICINE 0.6 MG PO TABS: 0.6000 mg | ORAL_TABLET | Freq: Two times a day (BID) | ORAL | 0 refills | Status: AC

## 2022-09-26 MED ORDER — DEXAMETHASONE SODIUM PHOSPHATE 10 MG/ML IJ SOLN
10.0000 mg | Freq: Once | INTRAMUSCULAR | Status: AC
  Administered 2022-09-26: 10 mg via INTRAMUSCULAR
  Filled 2022-09-26: qty 1

## 2022-09-26 MED ORDER — COLCHICINE 0.6 MG PO TABS
1.2000 mg | ORAL_TABLET | Freq: Once | ORAL | Status: AC
  Administered 2022-09-26: 1.2 mg via ORAL
  Filled 2022-09-26: qty 2

## 2022-09-26 NOTE — ED Provider Notes (Signed)
[facility] Provider Note  Patient Contact: 10:38 AM (approximate)   History   bunion pain   HPI  Brandi Evans is a 76 y.o. female presents to the emergency department with pain along the lateral aspect of her left foot at the metatarsal phalangeal joint of the left great toe.  Patient has a history of gout and current symptoms feel similar.  No falls or mechanisms of trauma.  No fever.  No history of septic joints.      Physical Exam   Triage Vital Signs: ED Triage Vitals  Enc Vitals Group     BP 09/26/22 1006 (!) 170/82     Pulse Rate 09/26/22 1006 61     Resp 09/26/22 1006 16     Temp 09/26/22 1006 97.6 F (36.4 C)     Temp Source 09/26/22 1006 Oral     SpO2 09/26/22 1006 97 %     Weight 09/26/22 1007 140 lb (63.5 kg)     Height 09/26/22 1007 '5\' 3"'$  (1.6 m)     Head Circumference --      Peak Flow --      Pain Score 09/26/22 1006 10     Pain Loc --      Pain Edu? --      Excl. in [place]? --     Most recent vital signs: Vitals:   09/26/22 1006  BP: (!) 170/82  Pulse: 61  Resp: 16  Temp: 97.6 F (36.4 C)  SpO2: 97%     General: Alert and in no acute distress. Eyes:  PERRL. EOMI. Head: No acute traumatic findings ENT:      Nose: No congestion/rhinnorhea.      Mouth/Throat: Mucous membranes are moist. Neck: No stridor. No cervical spine tenderness to palpation. Cardiovascular:  Good peripheral perfusion Respiratory: Normal respiratory effort without tachypnea or retractions. Lungs CTAB. Good air entry to the bases with no decreased or absent breath sounds. Gastrointestinal: Bowel sounds 4 quadrants. Soft and nontender to palpation. No guarding or rigidity. No palpable masses. No distention. No CVA tenderness. Musculoskeletal: Full range of motion to all extremities.  Neurologic:  No gross focal neurologic deficits are appreciated.  Skin: Patient has erythema and mild edema over the first MTP of the left foot.    ED Results  / Procedures / Treatments   Labs (all labs ordered are listed, but only abnormal results are displayed) Labs Reviewed - No data to display    RADIOLOGY  I personally viewed and evaluated these images as part of my medical decision making, as well as reviewing the written report by the radiologist.  ED Provider Interpretation: No acute bony abnormality   PROCEDURES:  Critical Care performed: No  Procedures   MEDICATIONS ORDERED IN ED: Medications  dexamethasone (DECADRON) injection 10 mg (has no administration in time range)  colchicine tablet 1.2 mg (has no administration in time range)     IMPRESSION / MDM / ASSESSMENT AND PLAN / ED COURSE  I reviewed the triage vital signs and the nursing notes.                              Assessment and plan Gout 76 year old female presents to the emergency department with left foot pain concerning for gout.  Patient was hypertensive at triage but vital signs were otherwise reassuring.  Will treat patient with her first dose of colchicine and administer Decadron injection.  Patient was discharged with colchicine.      FINAL CLINICAL IMPRESSION(S) / ED DIAGNOSES   Final diagnoses:  Acute gout of left foot, unspecified cause     Rx / DC Orders   ED Discharge Orders          Ordered    colchicine 0.6 MG tablet  2 times daily        09/26/22 1037             Note:  This document was prepared using Dragon voice recognition software and may include unintentional dictation errors.   Vallarie Mare [place], PA-C 09/26/22 1042    Rada Hay, MD 09/26/22 1323

## 2022-09-26 NOTE — ED Triage Notes (Signed)
Pt to ED POV for bunion pain to L foot since yesterday. Has had bunion for years and sees Dr Luana Shu, podiatrist. Pt states she needs pain control and surgery. Informed her that ED does not arrange surgery, this is outpatient.   Hx gout, does not take meds because ran out.   Bunion area appears reddened. Pain is 10/10. Pt is ambulatory with steady gait.

## 2022-09-26 NOTE — Discharge Instructions (Addendum)
Take colchicine daily for the next seven days.

## 2022-09-26 NOTE — ED Notes (Signed)
E signature pad not working 

## 2022-09-30 ENCOUNTER — Telehealth: Payer: Self-pay

## 2022-09-30 NOTE — Telephone Encounter (Signed)
        Patient  visited [place] on 12/16    Telephone encounter attempt :  1st  A HIPAA compliant voice message was left requesting a return call.  Instructed patient to call back    [place], [place] [facility]  [phone] [address], [place], [place] [address] Phone: [phone] Email: [email or web address]

## 2022-10-01 ENCOUNTER — Telehealth: Payer: Self-pay

## 2022-10-01 NOTE — Telephone Encounter (Signed)
     Patient  visit on 12/16  at [place]   Have you been able to follow up with your primary care physician? Patient will call for a follow up today because she feels like she needs more medication  The patient was or was not able to obtain any needed medicine or equipment. Yes   Are there diet recommendations that you are having difficulty following? NA   Patient expresses understanding of discharge instructions and education provided has no other needs at this time. Yes      [place], [facility], Care Management  [phone] [address], [place], [place] [address] Phone: [phone] Email: [email or web address]
